# Patient Record
Sex: Male | Born: 1985 | Race: White | Hispanic: No | Marital: Single | Smoking: Former smoker
Health system: Southern US, Community
[De-identification: ages and names within clinical notes are randomized; demographics above are authoritative.]

## PROBLEM LIST (undated history)

## (undated) DIAGNOSIS — S83289A Other tear of lateral meniscus, current injury, unspecified knee, initial encounter: Secondary | ICD-10-CM

## (undated) DIAGNOSIS — K219 Gastro-esophageal reflux disease without esophagitis: Secondary | ICD-10-CM

## (undated) DIAGNOSIS — M2341 Loose body in knee, right knee: Secondary | ICD-10-CM

## (undated) DIAGNOSIS — J45909 Unspecified asthma, uncomplicated: Secondary | ICD-10-CM

## (undated) HISTORY — PX: KNEE ARTHROSCOPY W/ ACL RECONSTRUCTION: SHX1858

## (undated) HISTORY — PX: KNEE ARTHROSCOPY WITH MENISCAL REPAIR: SHX5653

## (undated) HISTORY — PX: BICEPS TENDON REPAIR: SHX566

## (undated) HISTORY — PX: LABRAL REPAIR: SHX5172

---

## 2006-05-17 ENCOUNTER — Emergency Department (HOSPITAL_COMMUNITY): Admission: EM | Admit: 2006-05-17 | Discharge: 2006-05-17 | Payer: Self-pay | Admitting: Emergency Medicine

## 2007-08-03 ENCOUNTER — Emergency Department (HOSPITAL_COMMUNITY): Admission: EM | Admit: 2007-08-03 | Discharge: 2007-08-03 | Payer: Self-pay | Admitting: Emergency Medicine

## 2007-08-08 ENCOUNTER — Emergency Department (HOSPITAL_COMMUNITY): Admission: EM | Admit: 2007-08-08 | Discharge: 2007-08-08 | Payer: Self-pay | Admitting: Emergency Medicine

## 2007-08-15 ENCOUNTER — Ambulatory Visit (HOSPITAL_COMMUNITY): Admission: RE | Admit: 2007-08-15 | Discharge: 2007-08-15 | Payer: Self-pay | Admitting: Orthopedic Surgery

## 2008-04-05 ENCOUNTER — Emergency Department (HOSPITAL_COMMUNITY): Admission: EM | Admit: 2008-04-05 | Discharge: 2008-04-05 | Payer: Self-pay | Admitting: Emergency Medicine

## 2009-08-25 IMAGING — CR DG KNEE COMPLETE 4+V*R*
4 series · 4 of 4 positions shown · non-contrast
Comparison: 05/17/2006

CLINICAL DATA: Right knee injury playing basketball

RIGHT KNEE - COMPLETE 4+ VIEW

[t knee ap right]
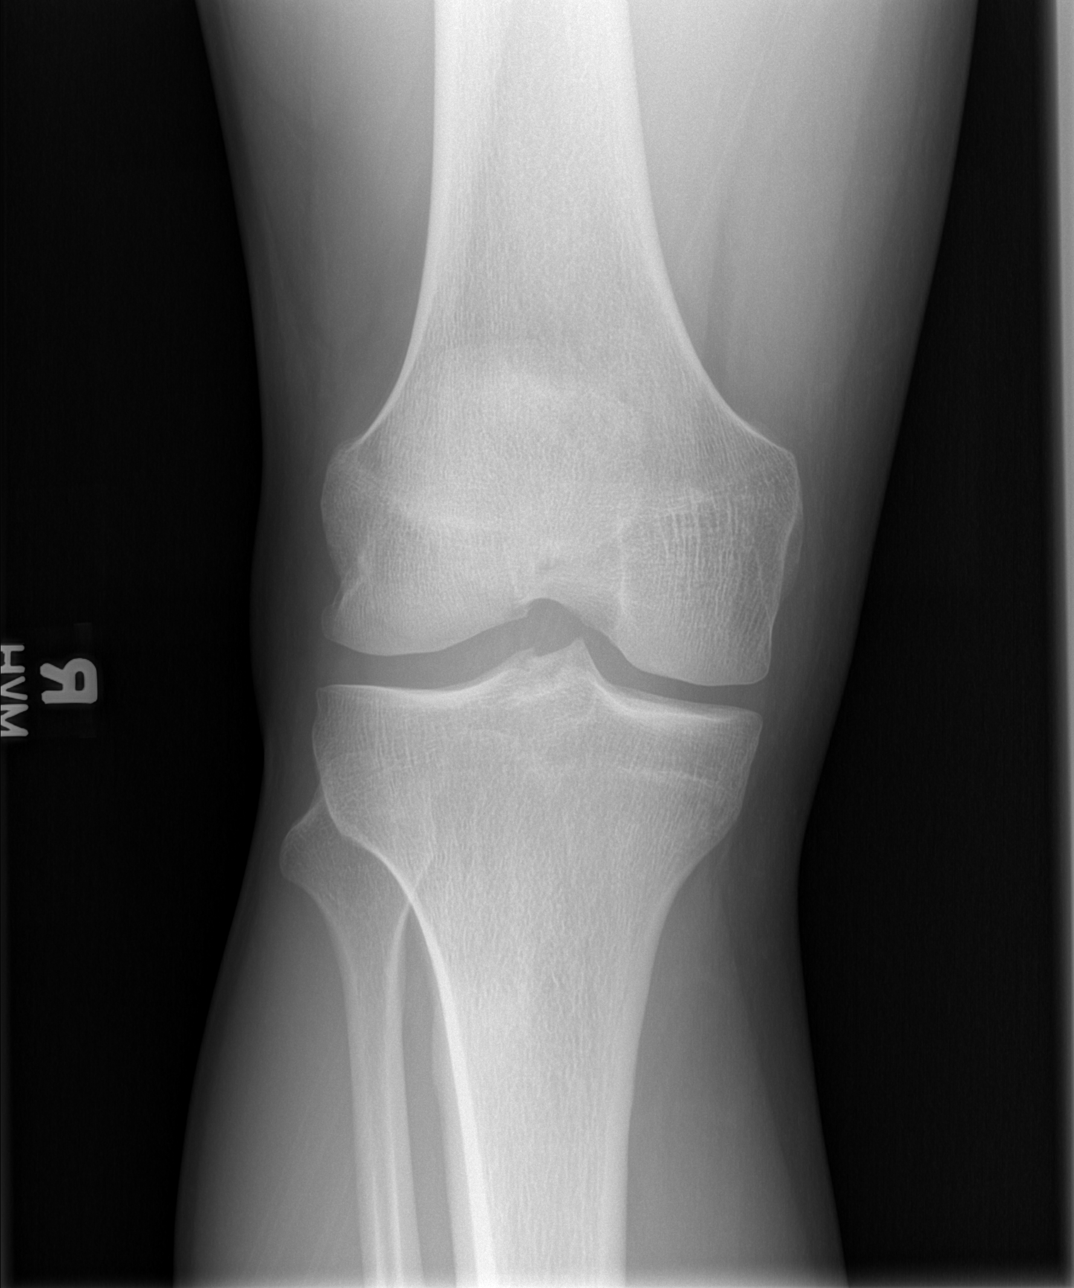

[t knee oblique right (1 of 2)]
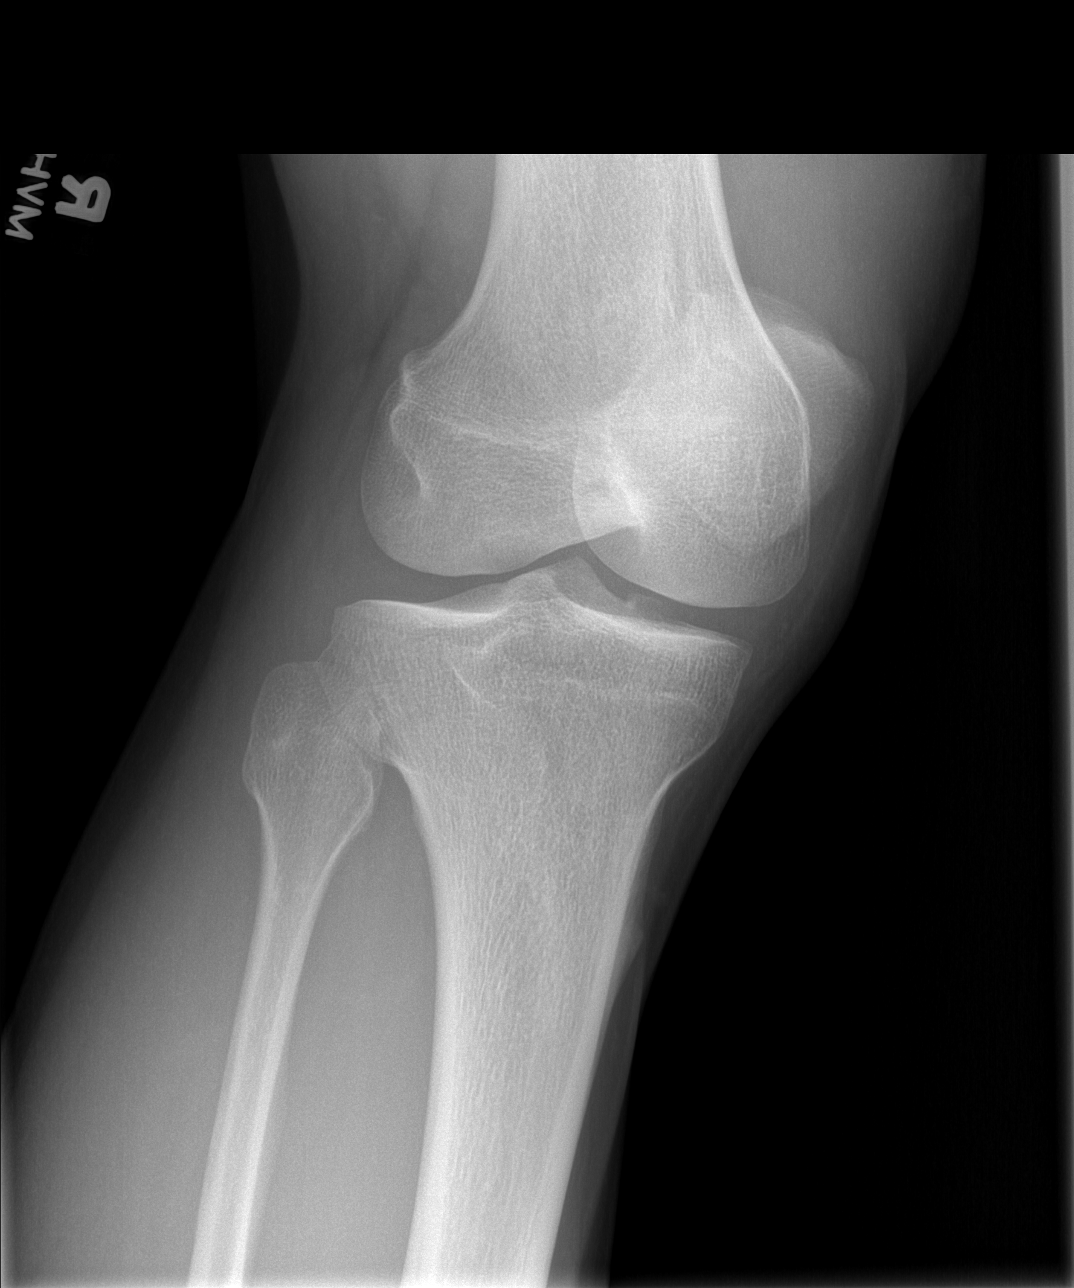

[t knee oblique right (2 of 2)]
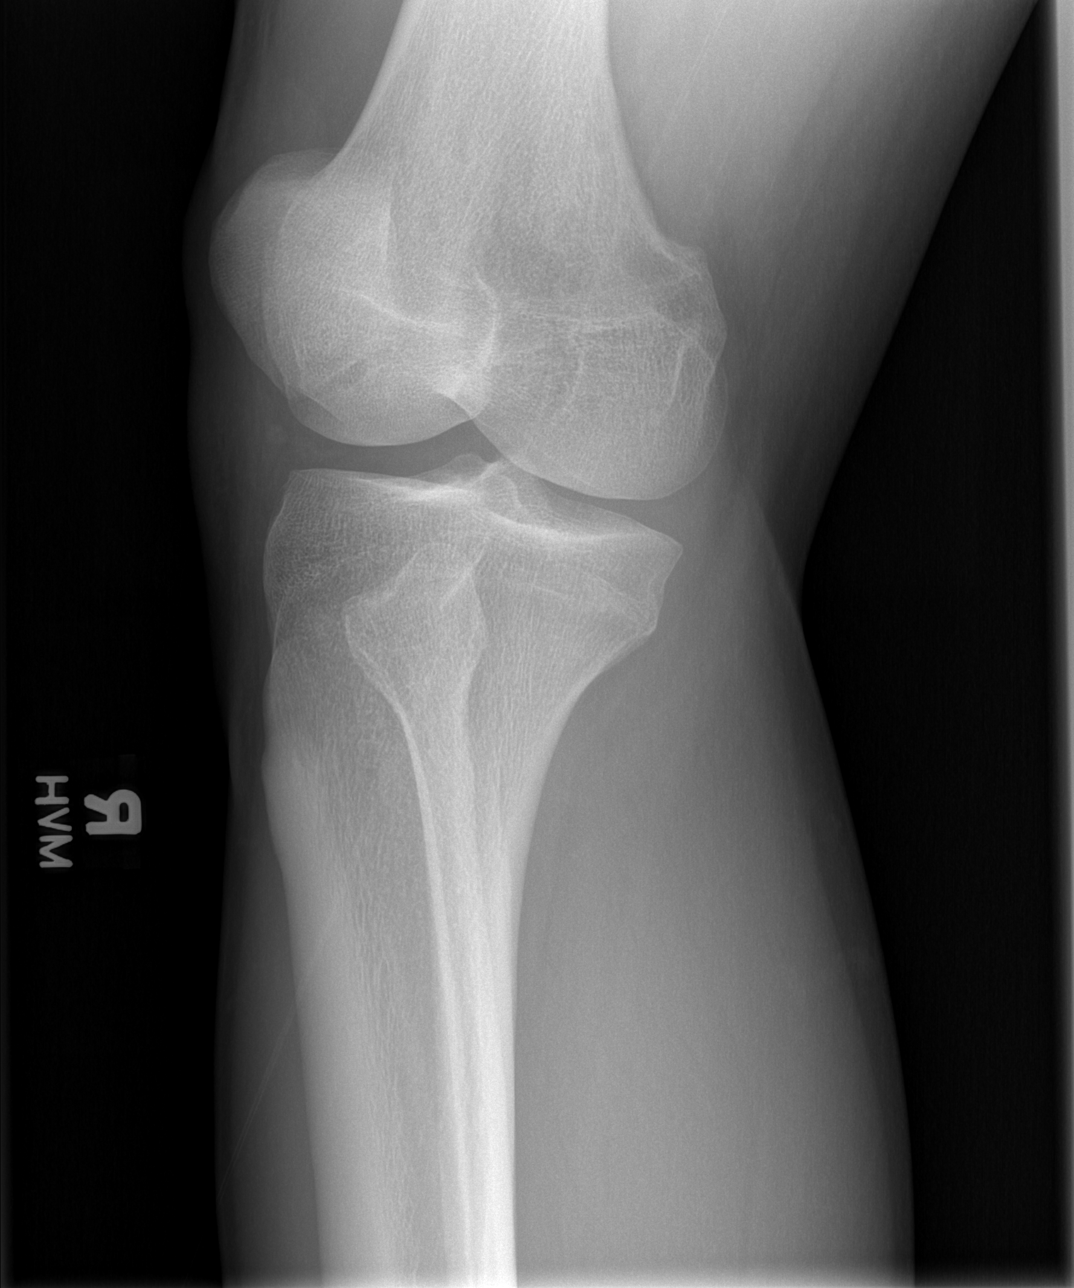

[t knee lat right]
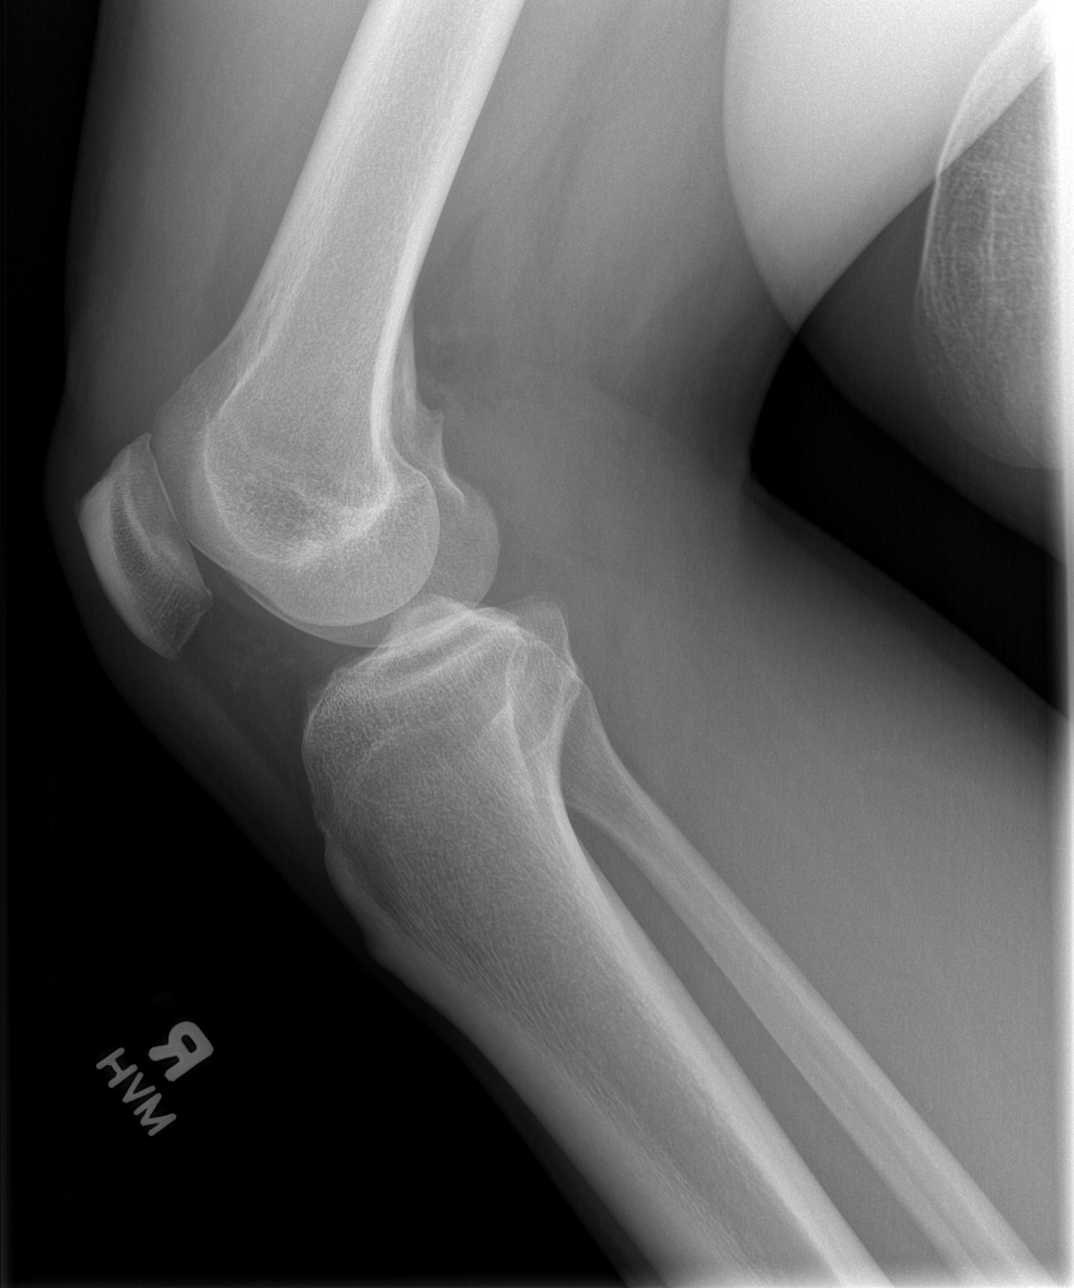

[4 of 4 positions shown; findings below may reference images not displayed]

FINDINGS: The joint spaces are maintained.  No fractures are seen.
There may be a small joint effusion.
IMPRESSION: 1.  No acute bony findings or degenerative changes.
2.  Possible small joint effusion.

## 2009-09-02 ENCOUNTER — Emergency Department (HOSPITAL_COMMUNITY): Admission: EM | Admit: 2009-09-02 | Discharge: 2009-09-02 | Payer: Self-pay | Admitting: Emergency Medicine

## 2009-10-14 ENCOUNTER — Ambulatory Visit (HOSPITAL_COMMUNITY): Admission: RE | Admit: 2009-10-14 | Discharge: 2009-10-14 | Payer: Self-pay | Admitting: Orthopedic Surgery

## 2009-10-14 HISTORY — PX: KNEE ARTHROSCOPY W/ ACL RECONSTRUCTION: SHX1858

## 2010-05-19 LAB — COMPREHENSIVE METABOLIC PANEL
Alkaline Phosphatase: 68 U/L (ref 39–117)
BUN: 16 mg/dL (ref 6–23)
Calcium: 10.3 mg/dL (ref 8.4–10.5)
Creatinine, Ser: 1.19 mg/dL (ref 0.4–1.5)
Sodium: 142 mEq/L (ref 135–145)
Total Bilirubin: 0.8 mg/dL (ref 0.3–1.2)
Total Protein: 7.5 g/dL (ref 6.0–8.3)

## 2010-05-19 LAB — APTT: aPTT: 29 seconds (ref 24–37)

## 2010-05-19 LAB — CBC
MCH: 31.3 pg (ref 26.0–34.0)
MCHC: 34.1 g/dL (ref 30.0–36.0)
MCV: 91.9 fL (ref 78.0–100.0)
Platelets: 239 10*3/uL (ref 150–400)

## 2010-05-19 LAB — PROTIME-INR
INR: 0.98 (ref 0.00–1.49)
Prothrombin Time: 13.2 seconds (ref 11.6–15.2)

## 2011-02-09 ENCOUNTER — Emergency Department (HOSPITAL_COMMUNITY)
Admission: EM | Admit: 2011-02-09 | Discharge: 2011-02-09 | Disposition: A | Discharge status: Home / Self Care | Payer: Self-pay | Attending: Emergency Medicine | Admitting: Emergency Medicine

## 2011-02-09 ENCOUNTER — Encounter: Payer: Self-pay | Admitting: *Deleted

## 2011-02-09 DIAGNOSIS — R059 Cough, unspecified: Secondary | ICD-10-CM | POA: Insufficient documentation

## 2011-02-09 DIAGNOSIS — R05 Cough: Secondary | ICD-10-CM | POA: Insufficient documentation

## 2011-02-09 DIAGNOSIS — J069 Acute upper respiratory infection, unspecified: Secondary | ICD-10-CM | POA: Insufficient documentation

## 2011-02-09 DIAGNOSIS — J329 Chronic sinusitis, unspecified: Secondary | ICD-10-CM | POA: Insufficient documentation

## 2011-02-09 MED ORDER — MAGIC MOUTHWASH W/LIDOCAINE: 5.0000 mL | Freq: Four times a day (QID) | ORAL | Status: DC | PRN

## 2011-02-09 NOTE — ED Provider Notes (Signed)
History     CSN: [id number] Arrival date & time: 02/09/2011  8:13 PM   First MD Initiated Contact with Patient 02/09/11 2033      Chief Complaint  Patient presents with  . URI    (Consider location/radiation/quality/duration/timing/severity/associated sxs/prior treatment) Patient is a 25 y.o. male presenting with URI. The history is provided by the patient.  URI The primary symptoms include cough. Primary symptoms do not include fever, sore throat, wheezing, nausea or vomiting. Primary symptoms comment: sinus drainage and gum pain The current episode started 3 to 5 days ago. This is a new problem. The problem has been gradually worsening.  The onset of the illness is associated with exposure to sick contacts. Symptoms associated with the illness include chills, facial pain, sinus pressure, congestion and rhinorrhea. Associated symptoms comments: Gum pain . The following treatments were addressed: Acetaminophen was ineffective. A decongestant was ineffective. NSAIDs were ineffective.    History reviewed. No pertinent past medical history.  History reviewed. No pertinent past surgical history.  History reviewed. No pertinent family history.  History  Substance Use Topics  . Smoking status: Never Smoker   . Smokeless tobacco: Not on file  . Alcohol Use: Yes      Review of Systems  Constitutional: Positive for chills. Negative for fever.  HENT: Positive for congestion, rhinorrhea and sinus pressure. Negative for sore throat.   Respiratory: Positive for cough. Negative for wheezing.   Gastrointestinal: Negative for nausea and vomiting.  All other systems reviewed and are negative.    Allergies  Review of patient's allergies indicates no known allergies.  Home Medications   Current Outpatient Rx  Name Route Sig Dispense Refill  . MAGIC MOUTHWASH W/LIDOCAINE Oral Take 5 mLs by mouth 4 (four) times daily as needed. 30 mL 1    BP 124/76  Pulse 88  Temp(Src) 98.1 F  (36.7 C) (Oral)  Resp 20  SpO2 100%  Physical Exam  Nursing note and vitals reviewed. Constitutional: He is oriented to person, place, and time. He appears well-developed and well-nourished. No distress.  HENT:  Head: Normocephalic and atraumatic.  Mouth/Throat: Oropharynx is clear and moist.       No abnormalities of the gums.  No exudate or lesions  Eyes: Conjunctivae and EOM are normal. Pupils are equal, round, and reactive to light.  Neck: Normal range of motion. Neck supple.  Cardiovascular: Normal rate, regular rhythm and intact distal pulses.   No murmur heard. Pulmonary/Chest: Effort normal and breath sounds normal. No respiratory distress. He has no wheezes. He has no rales.  Abdominal: Soft. He exhibits no distension. There is no tenderness. There is no rebound and no guarding.  Musculoskeletal: Normal range of motion. He exhibits no edema and no tenderness.  Lymphadenopathy:    He has no cervical adenopathy.  Neurological: He is alert and oriented to person, place, and time.  Skin: Skin is warm and dry. No rash noted. No erythema.  Psychiatric: He has a normal mood and affect. His behavior is normal.    ED Course  Procedures (including critical care time)  Labs Reviewed - No data to display No results found.   1. URI, acute   2. Sinusitis       MDM   Pt with symptoms consistent with viral URI.  Well appearing here.  No signs of breathing difficulty  No signs of pharyngitis, otitis or abnormal abdominal findings.  Pt states that his gums hurt however there are no lesions or signs  of abnormalities of the gums.  Teeth and gums appear healthy and may be from URI and sinus drainage.  Will give magic mouthwash for pain and to continue NSAIDs and nasal sprays.          Gwyneth Sprout, MD 02/10/11 [phone]

## 2011-02-09 NOTE — ED Notes (Signed)
Pt in c/o cough and congestion and aches x3-4 days

## 2011-02-11 ENCOUNTER — Ambulatory Visit: Payer: Self-pay

## 2011-02-11 DIAGNOSIS — J209 Acute bronchitis, unspecified: Secondary | ICD-10-CM

## 2011-02-11 DIAGNOSIS — K0510 Chronic gingivitis, plaque induced: Secondary | ICD-10-CM

## 2011-02-11 DIAGNOSIS — J019 Acute sinusitis, unspecified: Secondary | ICD-10-CM

## 2013-12-29 ENCOUNTER — Other Ambulatory Visit: Payer: Self-pay | Admitting: Family Medicine

## 2013-12-29 DIAGNOSIS — R32 Unspecified urinary incontinence: Secondary | ICD-10-CM

## 2013-12-29 DIAGNOSIS — R351 Nocturia: Secondary | ICD-10-CM

## 2013-12-31 ENCOUNTER — Ambulatory Visit
Admission: RE | Admit: 2013-12-31 | Discharge: 2013-12-31 | Disposition: A | Discharge status: Home / Self Care | Payer: BC Managed Care – PPO | Source: Ambulatory Visit | Attending: Family Medicine | Admitting: Family Medicine

## 2013-12-31 DIAGNOSIS — R32 Unspecified urinary incontinence: Secondary | ICD-10-CM

## 2013-12-31 DIAGNOSIS — R351 Nocturia: Secondary | ICD-10-CM

## 2015-10-04 DIAGNOSIS — M2341 Loose body in knee, right knee: Secondary | ICD-10-CM

## 2015-10-04 DIAGNOSIS — S83289A Other tear of lateral meniscus, current injury, unspecified knee, initial encounter: Secondary | ICD-10-CM

## 2015-10-04 HISTORY — DX: Loose body in knee, right knee: M23.41

## 2015-10-04 HISTORY — DX: Other tear of lateral meniscus, current injury, unspecified knee, initial encounter: S83.289A

## 2015-10-27 ENCOUNTER — Encounter (HOSPITAL_BASED_OUTPATIENT_CLINIC_OR_DEPARTMENT_OTHER): Payer: Self-pay | Admitting: Physician Assistant

## 2015-10-27 DIAGNOSIS — S83281A Other tear of lateral meniscus, current injury, right knee, initial encounter: Secondary | ICD-10-CM | POA: Diagnosis present

## 2015-10-27 DIAGNOSIS — M2341 Loose body in knee, right knee: Secondary | ICD-10-CM | POA: Diagnosis present

## 2015-10-27 DIAGNOSIS — Z9889 Other specified postprocedural states: Secondary | ICD-10-CM

## 2015-10-27 NOTE — H&P (Signed)
Joel Kelly is an 30 y.o. male.   Chief Complaint: right knee pain HPI: Joel is a 30 year-old seen for follow up evaluation from his right knee possible meniscal tear.  Two years status post right knee arthroscopy for partial meniscectomy and chondroplasty and he is status post ACL reconstruction by Dr. Lajoyce Cornersuda prior to that.  He has been having intermittent catching and popping in the knee without a new injury.  He underwent a right knee MRI on October 15, 2015 that revealed an indistinct ACL graft, however this is unchanged from his previous MRI prior to his previous knee arthroscopy where his ACL graft was intact.  He does have a possible loose femoral interference screw, but this is unchanged as well.  No definite new meniscal tear.    Past Medical History:  Diagnosis Date  . Acute lateral meniscus tear of right knee   . Loose body of right knee   . Status post anterior cruciate ligament surgery     No past surgical history on file.  No family history on file. Social History:  reports that he has never smoked. He does not have any smokeless tobacco history on file. He reports that he drinks alcohol. He reports that he does not use drugs.  Allergies: No Known Allergies  No prescriptions prior to admission.    No results found for this or any previous visit (from the past 48 hour(s)). No results found.  Review of Systems  Constitutional: Negative.   HENT: Negative.   Eyes: Negative.   Respiratory: Negative.   Cardiovascular: Negative.   Gastrointestinal: Negative.   Genitourinary: Negative.   Musculoskeletal: Positive for joint pain.  Skin: Negative.   Neurological: Negative.   Endo/Heme/Allergies: Negative.   Psychiatric/Behavioral: Negative.     There were no vitals taken for this visit. Physical Exam  Constitutional: He is oriented to person, place, and time. He appears well-developed and well-nourished.  HENT:  Head: Normocephalic and atraumatic.  Mouth/Throat:  Oropharynx is clear and moist.  Eyes: Conjunctivae and EOM are normal. Pupils are equal, round, and reactive to light.  Neck: Neck supple.  Cardiovascular: Normal rate.   Respiratory: Effort normal.  GI: Soft.  Genitourinary:  Genitourinary Comments: Not pertinent to current symptomatology therefore not examined.  Musculoskeletal:   Examination of his right knee reveals pain laterally and posterolaterally.  1+ synovitis.  Full range of motion.  Knee is stable with normal patella tracking.  Previous well healed incisions are noted.     Neurological: He is alert and oriented to person, place, and time.  Skin: Skin is warm and dry.  Psychiatric: He has a normal mood and affect. His behavior is normal.     Assessment Active Problems:   Acute lateral meniscus tear of right knee   Status post anterior cruciate ligament surgery   Loose body of right knee   Plan Right knee arthroscopy with partial lateral meniscectomy and loose body excision.  The risks, benefits, and possible complications of the procedure were discussed in detail with the patient.  The patient is without question.  Pascal LuxSHEPPERSON,Raisha Brabender J, PA-C 10/27/2015, 4:49 PM

## 2015-10-28 ENCOUNTER — Encounter (HOSPITAL_BASED_OUTPATIENT_CLINIC_OR_DEPARTMENT_OTHER): Payer: Self-pay | Admitting: *Deleted

## 2015-11-02 ENCOUNTER — Encounter (HOSPITAL_BASED_OUTPATIENT_CLINIC_OR_DEPARTMENT_OTHER)
Admission: RE | Disposition: A | Discharge status: Home / Self Care | Payer: Self-pay | Source: Ambulatory Visit | Attending: Orthopedic Surgery

## 2015-11-02 ENCOUNTER — Ambulatory Visit (HOSPITAL_BASED_OUTPATIENT_CLINIC_OR_DEPARTMENT_OTHER)
Admission: RE | Admit: 2015-11-02 | Discharge: 2015-11-02 | Disposition: A | Discharge status: Home / Self Care | Payer: 59 | Source: Ambulatory Visit | Attending: Orthopedic Surgery | Admitting: Orthopedic Surgery

## 2015-11-02 ENCOUNTER — Ambulatory Visit (HOSPITAL_BASED_OUTPATIENT_CLINIC_OR_DEPARTMENT_OTHER): Payer: 59 | Admitting: Anesthesiology

## 2015-11-02 ENCOUNTER — Encounter (HOSPITAL_BASED_OUTPATIENT_CLINIC_OR_DEPARTMENT_OTHER): Payer: Self-pay | Admitting: Certified Registered"

## 2015-11-02 DIAGNOSIS — M2341 Loose body in knee, right knee: Secondary | ICD-10-CM | POA: Diagnosis present

## 2015-11-02 DIAGNOSIS — M21961 Unspecified acquired deformity of right lower leg: Secondary | ICD-10-CM | POA: Insufficient documentation

## 2015-11-02 DIAGNOSIS — X501XXA Overexertion from prolonged static or awkward postures, initial encounter: Secondary | ICD-10-CM | POA: Diagnosis not present

## 2015-11-02 DIAGNOSIS — S83281A Other tear of lateral meniscus, current injury, right knee, initial encounter: Secondary | ICD-10-CM | POA: Diagnosis present

## 2015-11-02 DIAGNOSIS — M94261 Chondromalacia, right knee: Secondary | ICD-10-CM | POA: Diagnosis not present

## 2015-11-02 DIAGNOSIS — K219 Gastro-esophageal reflux disease without esophagitis: Secondary | ICD-10-CM | POA: Insufficient documentation

## 2015-11-02 DIAGNOSIS — Z9889 Other specified postprocedural states: Secondary | ICD-10-CM

## 2015-11-02 DIAGNOSIS — T84032A Mechanical loosening of internal right knee prosthetic joint, initial encounter: Secondary | ICD-10-CM | POA: Diagnosis not present

## 2015-11-02 DIAGNOSIS — Y838 Other surgical procedures as the cause of abnormal reaction of the patient, or of later complication, without mention of misadventure at the time of the procedure: Secondary | ICD-10-CM | POA: Insufficient documentation

## 2015-11-02 HISTORY — DX: Gastro-esophageal reflux disease without esophagitis: K21.9

## 2015-11-02 HISTORY — PX: KNEE ARTHROSCOPY WITH LATERAL MENISECTOMY: SHX6193

## 2015-11-02 HISTORY — DX: Other tear of lateral meniscus, current injury, unspecified knee, initial encounter: S83.289A

## 2015-11-02 HISTORY — DX: Loose body in knee, right knee: M23.41

## 2015-11-02 SURGERY — ARTHROSCOPY, KNEE, WITH LATERAL MENISCECTOMY
Anesthesia: General | Site: Knee | Laterality: Right

## 2015-11-02 MED ORDER — FENTANYL CITRATE (PF) 100 MCG/2ML IJ SOLN
INTRAMUSCULAR | Status: AC
  Filled 2015-11-02: qty 2

## 2015-11-02 MED ORDER — LACTATED RINGERS IV SOLN
INTRAVENOUS | Status: DC
  Administered 2015-11-02: 14:00:00 via INTRAVENOUS

## 2015-11-02 MED ORDER — HYDROMORPHONE HCL 1 MG/ML IJ SOLN
INTRAMUSCULAR | Status: AC
  Filled 2015-11-02: qty 1

## 2015-11-02 MED ORDER — CEFAZOLIN SODIUM-DEXTROSE 2-4 GM/100ML-% IV SOLN
2.0000 g | INTRAVENOUS | Status: AC
Start: 2015-11-03 — End: 2015-11-02
  Administered 2015-11-02: 2 g via INTRAVENOUS

## 2015-11-02 MED ORDER — LIDOCAINE 2% (20 MG/ML) 5 ML SYRINGE
INTRAMUSCULAR | Status: DC | PRN
  Administered 2015-11-02: 60 mg via INTRAVENOUS

## 2015-11-02 MED ORDER — POVIDONE-IODINE 7.5 % EX SOLN: Freq: Once | CUTANEOUS | Status: DC

## 2015-11-02 MED ORDER — HYDROCODONE-ACETAMINOPHEN 5-325 MG PO TABS
1.0000 | ORAL_TABLET | Freq: Once | ORAL | Status: AC | PRN
  Administered 2015-11-02: 1 via ORAL

## 2015-11-02 MED ORDER — BUPIVACAINE-EPINEPHRINE 0.25% -1:200000 IJ SOLN
INTRAMUSCULAR | Status: DC | PRN
  Administered 2015-11-02: 30 mL

## 2015-11-02 MED ORDER — LIDOCAINE 2% (20 MG/ML) 5 ML SYRINGE
INTRAMUSCULAR | Status: AC
  Filled 2015-11-02: qty 5

## 2015-11-02 MED ORDER — ONDANSETRON HCL 4 MG/2ML IJ SOLN
INTRAMUSCULAR | Status: AC
  Filled 2015-11-02: qty 2

## 2015-11-02 MED ORDER — FENTANYL CITRATE (PF) 100 MCG/2ML IJ SOLN
50.0000 ug | INTRAMUSCULAR | Status: AC | PRN
  Administered 2015-11-02 (×4): 50 ug via INTRAVENOUS

## 2015-11-02 MED ORDER — LACTATED RINGERS IV SOLN
INTRAVENOUS | Status: DC
  Administered 2015-11-02 (×2): via INTRAVENOUS

## 2015-11-02 MED ORDER — SCOPOLAMINE 1 MG/3DAYS TD PT72: 1.0000 | MEDICATED_PATCH | Freq: Once | TRANSDERMAL | Status: DC | PRN

## 2015-11-02 MED ORDER — PROMETHAZINE HCL 25 MG/ML IJ SOLN: 6.2500 mg | INTRAMUSCULAR | Status: DC | PRN

## 2015-11-02 MED ORDER — CEFAZOLIN SODIUM-DEXTROSE 2-4 GM/100ML-% IV SOLN
INTRAVENOUS | Status: AC
  Filled 2015-11-02: qty 100

## 2015-11-02 MED ORDER — HYDROCODONE-ACETAMINOPHEN 7.5-325 MG PO TABS: 1.0000 | ORAL_TABLET | Freq: Once | ORAL | Status: DC | PRN

## 2015-11-02 MED ORDER — HYDROCODONE-ACETAMINOPHEN 5-325 MG PO TABS
ORAL_TABLET | ORAL | Status: AC
  Filled 2015-11-02: qty 1

## 2015-11-02 MED ORDER — MIDAZOLAM HCL 2 MG/2ML IJ SOLN
1.0000 mg | INTRAMUSCULAR | Status: DC | PRN
  Administered 2015-11-02: 2 mg via INTRAVENOUS

## 2015-11-02 MED ORDER — PROPOFOL 10 MG/ML IV BOLUS
INTRAVENOUS | Status: DC | PRN
  Administered 2015-11-02: 200 mg via INTRAVENOUS
  Administered 2015-11-02: 100 mg via INTRAVENOUS

## 2015-11-02 MED ORDER — HYDROMORPHONE HCL 1 MG/ML IJ SOLN
0.2500 mg | INTRAMUSCULAR | Status: DC | PRN
  Administered 2015-11-02 (×4): 0.5 mg via INTRAVENOUS

## 2015-11-02 MED ORDER — MIDAZOLAM HCL 2 MG/2ML IJ SOLN
INTRAMUSCULAR | Status: AC
  Filled 2015-11-02: qty 2

## 2015-11-02 MED ORDER — LIDOCAINE HCL (CARDIAC) 20 MG/ML IV SOLN: INTRAVENOUS | Status: DC | PRN

## 2015-11-02 MED ORDER — GLYCOPYRROLATE 0.2 MG/ML IJ SOLN: 0.2000 mg | Freq: Once | INTRAMUSCULAR | Status: DC | PRN

## 2015-11-02 MED ORDER — DEXAMETHASONE SODIUM PHOSPHATE 10 MG/ML IJ SOLN
INTRAMUSCULAR | Status: AC
  Filled 2015-11-02: qty 1

## 2015-11-02 MED ORDER — HYDROCODONE-ACETAMINOPHEN 5-325 MG PO TABS: ORAL_TABLET | ORAL | 0 refills | Status: DC

## 2015-11-02 SURGICAL SUPPLY — 46 items
BANDAGE ACE 6X5 VEL STRL LF (GAUZE/BANDAGES/DRESSINGS) ×3 IMPLANT
BLADE CUDA GRT WHITE 3.5 (BLADE) IMPLANT
BLADE CUTTER GATOR 3.5 (BLADE) ×2 IMPLANT
BLADE GREAT WHITE 4.2 (BLADE) IMPLANT
BLADE GREAT WHITE 4.2MM (BLADE)
BLADE SURG 15 STRL LF DISP TIS (BLADE) IMPLANT
BLADE SURG 15 STRL SS (BLADE)
BNDG COHESIVE 4X5 TAN STRL (GAUZE/BANDAGES/DRESSINGS) ×2 IMPLANT
BUR OVAL 4.0 (BURR) ×2 IMPLANT
DRAPE ARTHROSCOPY W/POUCH 90 (DRAPES) ×3 IMPLANT
DURAPREP 26ML APPLICATOR (WOUND CARE) ×3 IMPLANT
GAUZE SPONGE 4X4 12PLY STRL (GAUZE/BANDAGES/DRESSINGS) ×3 IMPLANT
GAUZE XEROFORM 1X8 LF (GAUZE/BANDAGES/DRESSINGS) ×3 IMPLANT
GLOVE BIO SURGEON STRL SZ 6.5 (GLOVE) ×1 IMPLANT
GLOVE BIO SURGEON STRL SZ7 (GLOVE) ×3 IMPLANT
GLOVE BIO SURGEONS STRL SZ 6.5 (GLOVE) ×1
GLOVE BIOGEL PI IND STRL 7.0 (GLOVE) ×1 IMPLANT
GLOVE BIOGEL PI IND STRL 7.5 (GLOVE) ×1 IMPLANT
GLOVE BIOGEL PI INDICATOR 7.0 (GLOVE) ×4
GLOVE BIOGEL PI INDICATOR 7.5 (GLOVE) ×2
GLOVE SS BIOGEL STRL SZ 7.5 (GLOVE) ×1 IMPLANT
GLOVE SUPERSENSE BIOGEL SZ 7.5 (GLOVE) ×2
GOWN STRL REUS W/ TWL LRG LVL3 (GOWN DISPOSABLE) ×3 IMPLANT
GOWN STRL REUS W/TWL LRG LVL3 (GOWN DISPOSABLE) ×9
HOLDER KNEE FOAM BLUE (MISCELLANEOUS) ×1 IMPLANT
KNEE WRAP E Z 3 GEL PACK (MISCELLANEOUS) ×3 IMPLANT
MANIFOLD NEPTUNE II (INSTRUMENTS) IMPLANT
NDL SAFETY ECLIPSE 18X1.5 (NEEDLE) ×2 IMPLANT
NEEDLE HYPO 18GX1.5 SHARP (NEEDLE) ×6
NEEDLE HYPO 22GX1.5 SAFETY (NEEDLE) IMPLANT
PACK ARTHROSCOPY DSU (CUSTOM PROCEDURE TRAY) ×3 IMPLANT
PACK BASIN DAY SURGERY FS (CUSTOM PROCEDURE TRAY) ×3 IMPLANT
PAD ALCOHOL SWAB (MISCELLANEOUS) ×6 IMPLANT
SET ARTHROSCOPY TUBING (MISCELLANEOUS) ×3
SET ARTHROSCOPY TUBING LN (MISCELLANEOUS) ×1 IMPLANT
SUCTION FRAZIER HANDLE 10FR (MISCELLANEOUS)
SUCTION TUBE FRAZIER 10FR DISP (MISCELLANEOUS) IMPLANT
SUT ETHILON 4 0 PS 2 18 (SUTURE) ×3 IMPLANT
SUT PROLENE 3 0 PS 2 (SUTURE) IMPLANT
SUT VIC AB 3-0 PS1 18 (SUTURE)
SUT VIC AB 3-0 PS1 18XBRD (SUTURE) IMPLANT
SYR 20CC LL (SYRINGE) IMPLANT
SYR 5ML LL (SYRINGE) ×3 IMPLANT
TOWEL OR 17X24 6PK STRL BLUE (TOWEL DISPOSABLE) ×3 IMPLANT
WAND STAR VAC 90 (SURGICAL WAND) IMPLANT
WATER STERILE IRR 1000ML POUR (IV SOLUTION) ×3 IMPLANT

## 2015-11-02 NOTE — Transfer of Care (Signed)
Immediate Anesthesia Transfer of Care Note  Patient: Joel Kelly  Procedure(s) Performed: Procedure(s) with comments: Right KNEE ARTHROSCOPY WITH LATERAL MENISCECTOMY and removal of foreign body, microfracture, chondroplasty (Right) - Right KNEE ARTHROSCOPY WITH LATERAL MENISCECTOMY and removal of foreign body, microfracture, chondroplasty  Patient Location: PACU  Anesthesia Type:General  Level of Consciousness: awake, sedated and patient cooperative  Airway & Oxygen Therapy: Patient Spontanous Breathing and Patient connected to face mask oxygen  Post-op Assessment: Report given to RN and Post -op Vital signs reviewed and stable  Post vital signs: Reviewed and stable  Last Vitals:  Vitals:   11/02/15 1349  BP: (!) 146/106  Pulse: 65  Resp: 16  Temp: 36.7 C    Last Pain:  Vitals:   11/02/15 1349  TempSrc: Oral  PainSc: 5          Complications: No apparent anesthesia complications

## 2015-11-02 NOTE — Anesthesia Postprocedure Evaluation (Signed)
Anesthesia Post Note  Patient: Joel Kelly  Procedure(s) Performed: Procedure(s) (LRB): Right KNEE ARTHROSCOPY WITH LATERAL MENISCECTOMY and removal of foreign body, microfracture, chondroplasty (Right)  Patient location during evaluation: PACU Anesthesia Type: General Level of consciousness: awake and alert Pain management: pain level controlled Vital Signs Assessment: post-procedure vital signs reviewed and stable Respiratory status: spontaneous breathing, nonlabored ventilation, respiratory function stable and patient connected to nasal cannula oxygen Cardiovascular status: blood pressure returned to baseline and stable Postop Assessment: no signs of nausea or vomiting Anesthetic complications: no    Last Vitals:  Vitals:   11/02/15 1630 11/02/15 1645  BP: 125/80 130/84  Pulse: 63 (!) 57  Resp: 16 15  Temp:      Last Pain:  Vitals:   11/02/15 1645  TempSrc:   PainSc: 5                  Kennieth RadFitzgerald, Samanda Buske E

## 2015-11-02 NOTE — Anesthesia Procedure Notes (Signed)
Procedure Name: LMA Insertion Date/Time: 11/02/2015 2:25 PM Performed by: Curly ShoresRAFT, Joel Kelly Pre-anesthesia Checklist: Patient identified, Emergency Drugs available, Suction available and Patient being monitored Patient Re-evaluated:Patient Re-evaluated prior to inductionOxygen Delivery Method: Circle system utilized Preoxygenation: Pre-oxygenation with 100% oxygen Intubation Type: IV induction Ventilation: Mask ventilation without difficulty LMA: LMA inserted LMA Size: 4.0 Number of attempts: 1 Airway Equipment and Method: Bite block Placement Confirmation: positive ETCO2 and breath sounds checked- equal and bilateral Tube secured with: Tape Dental Injury: Teeth and Oropharynx as per pre-operative assessment

## 2015-11-02 NOTE — Discharge Instructions (Signed)

## 2015-11-02 NOTE — Anesthesia Preprocedure Evaluation (Signed)
Anesthesia Evaluation  Patient identified by MRN, date of birth, ID band Patient awake    Reviewed: Allergy & Precautions, NPO status , Patient's Chart, lab work & pertinent test results  Airway Mallampati: II  TM Distance: >3 FB     Dental   Pulmonary neg pulmonary ROS,    Pulmonary exam normal        Cardiovascular negative cardio ROS Normal cardiovascular exam     Neuro/Psych negative neurological ROS     GI/Hepatic Neg liver ROS, GERD  ,  Endo/Other  negative endocrine ROS  Renal/GU negative Renal ROS     Musculoskeletal   Abdominal   Peds  Hematology negative hematology ROS (+)   Anesthesia Other Findings   Reproductive/Obstetrics                             Anesthesia Physical Anesthesia Plan  ASA: II  Anesthesia Plan: General   Post-op Pain Management:    Induction: Intravenous  Airway Management Planned: LMA  Additional Equipment:   Intra-op Plan:   Post-operative Plan: Extubation in OR  Informed Consent: I have reviewed the patients History and Physical, chart, labs and discussed the procedure including the risks, benefits and alternatives for the proposed anesthesia with the patient or authorized representative who has indicated his/her understanding and acceptance.   Dental advisory given  Plan Discussed with: CRNA  Anesthesia Plan Comments:         Anesthesia Quick Evaluation

## 2015-11-02 NOTE — Interval H&P Note (Signed)
History and Physical Interval Note:  11/02/2015 12:34 PM  Joel Kelly  has presented today for surgery, with the diagnosis of tear right lateral meniscus and loose body in knee  The various methods of treatment have been discussed with the patient and family. After consideration of risks, benefits and other options for treatment, the patient has consented to  Procedure(s): Right KNEE ARTHROSCOPY WITH LATERAL MENISCECTOMY and removal of foreign body (Right) as a surgical intervention .  The patient's history has been reviewed, patient examined, no change in status, stable for surgery.  I have reviewed the patient's chart and labs.  Questions were answered to the patient's satisfaction.     Salvatore MarvelWAINER,Juwann Sherk A

## 2015-11-03 ENCOUNTER — Encounter (HOSPITAL_BASED_OUTPATIENT_CLINIC_OR_DEPARTMENT_OTHER): Payer: Self-pay | Admitting: Orthopedic Surgery

## 2015-11-03 NOTE — Op Note (Signed)
NAME:  Joel Kelly, Joel Kelly               ACCOUNT NO.:  [id number]  MEDICAL RECORD NO.:  [id number]  LOCATION:                                 FACILITY:  PHYSICIAN:  Elana Alm. Thurston Hole, M.D. DATE OF BIRTH:  08/14/1985  DATE OF PROCEDURE:  11/02/2015 DATE OF DISCHARGE:                              OPERATIVE REPORT   PREOPERATIVE DIAGNOSES: 1. Right knee acute traumatic lateral meniscus tear. 2. Right knee retained loose foreign bodies x2/ACL screws. 3. Right knee acute traumatic lateral femoral condyle grade 4     osteochondral defect. 4. Right knee chondromalacia.  POSTOPERATIVE DIAGNOSES: 1. Right knee acute traumatic lateral meniscus tear. 2. Right knee retained loose foreign bodies x2/ACL screws. 3. Right knee acute traumatic lateral femoral condyle grade 4     osteochondral defect. 4. Right knee chondromalacia.  PROCEDURE: 1. Right knee EUA followed by arthroscopic partial lateral     meniscectomy. 2. Right knee removal of retained loose foreign bodies x2. 3. Right knee chondroplasty.  SURGEON:  Salvatore Marvel, MD.  ASSISTANT:  Julien Girt, PA-C.  ANESTHESIA:  General.  OPERATIVE TIME:  1 hour.  COMPLICATIONS:  None.  INDICATION FOR PROCEDURE:  Joel Kelly is a 30 year old gentleman who has had a twisting injury to his right knee approximately 3 months ago. Exam and MRI have revealed lateral meniscus tear, lateral femoral condyle osteochondral injury, retained loose ACL screws with chondromalacia.  He has failed conservative care and is now to undergo arthroscopy.  DESCRIPTION:  Joel Kelly was brought to the operating room on November 02, 2015.  After being placed on the table supine position, he was placed under general anesthesia.  His right knee was examined.  He had full range of motion, 1+ Lachman, knee otherwise stable with normal patellar tracking.  The knee was sterilely injected with 0.25% Marcaine with epinephrine.  The right leg was prepped using sterile  DuraPrep and draped using sterile technique.  Time-out procedure was called the correct right knee was identified.  Initially, through an anterolateral portal, the arthroscope with a pump attached was placed into an anteromedial portal and arthroscopic probe was placed.  On initial inspection of the medial compartment, he had 30-40% grade 3 chondromalacia, which was debrided.  Medial meniscus was intact. Intercondylar notch of his ACL graft was somewhat diminutive but only had 2-3 mm of anterior laxity with an endpoint.  PCL was intact and stable.  Lateral compartment was inspected.  The articular cartilage showed a distinct grade 4 chondral injury measuring 1 x 1.5 cm on lateral femoral condyle, which was amenable to microfracture drilling and a curved awl was used to make multiple small punctate holes for punctate bleeding for fibrocartilage healing.  The rest of the lateral compartment articular cartilage showed 30% grade 3 chondromalacia, which was debrided.  Lateral meniscus showed a tear of the posterior and lateral horn 30%, which was resected back to stable rim.  Looking in the posterior compartment, there were 2 loose ACL screws behind the ACL graft.  Using a central patellar tendon, 1.5 cm incision separately. These 2 screws were then carefully each removed.  They were completely loose and in the in the posterior compartment.  Patellofemoral joint showed 30% grade 3 chondromalacia, which was debrided.  The patella tracked normally.  Medial and lateral gutters were free of pathology. At this point, felt that all pathology had been satisfactorily addressed.  The instruments were removed.  The anterior, medial, and central patellar tendon incisions had to be extended to remove the loose bodies were closed with 2-0 Vicryl and 4-0 nylon, lateral portal closed with 4-0 nylon.  Sterile dressings were applied and then the patient awakened and taken to recovery room in stable condition.   Needle, sponge counts were correct x2 at the end of the case.  FOLLOWUP CARE:  Joel Kelly will be followed as an outpatient, on Norco for pain.  See me back in office in a week for wound check and followup.     Joel Kelly, M.D.   ______________________________ Elana Almobert A. Thurston Kelly, M.D.    RAW/MEDQ  D:  11/02/2015  T:  11/03/2015  Job:  [id number]

## 2016-09-08 ENCOUNTER — Emergency Department (HOSPITAL_COMMUNITY)
Admission: EM | Admit: 2016-09-08 | Discharge: 2016-09-08 | Disposition: A | Discharge status: Home / Self Care | Payer: 59 | Attending: Emergency Medicine | Admitting: Emergency Medicine

## 2016-09-08 ENCOUNTER — Encounter (HOSPITAL_COMMUNITY): Payer: Self-pay | Admitting: Emergency Medicine

## 2016-09-08 DIAGNOSIS — L03115 Cellulitis of right lower limb: Secondary | ICD-10-CM

## 2016-09-08 MED ORDER — DOXYCYCLINE HYCLATE 100 MG PO CAPS: 100.0000 mg | ORAL_CAPSULE | Freq: Two times a day (BID) | ORAL | 0 refills | Status: DC

## 2016-09-08 MED ORDER — IBUPROFEN 600 MG PO TABS: 600.0000 mg | ORAL_TABLET | Freq: Four times a day (QID) | ORAL | 0 refills | Status: DC | PRN

## 2016-09-08 NOTE — Discharge Instructions (Signed)
Return in 48 hrs if you notice no improvement or if your condition worsen.

## 2016-09-08 NOTE — ED Triage Notes (Signed)
Patient is complaining of abscess on right flank. Patient stated it came up as a pimple two days ago and has gotten worse.

## 2016-09-08 NOTE — ED Notes (Addendum)
Circle drawn around area to monitor redness spreading. Pt declined d/c vital signs.

## 2016-09-08 NOTE — ED Provider Notes (Signed)
[facility]-EMERGENCY DEPT Provider Note   CSN: [id number] Arrival date & time: 09/08/16  2036     History   Chief Complaint Chief Complaint  Patient presents with  . Abscess    HPI Joel Kelly is a 31 y.o. male.  HPI   31 year old male presenting complaining of skin infection. Patient states 3 days ago he noticed a small localized skin irritation to the right side of his hip buttock area near the belt line. The area was mildly tender but within the past 2 days he has noticed moderate amount of swelling and redness to that area. He did not recall any specific insect bite and did not notice any tick bite. He denies having fever, headache, abdominal cramping, nausea or vomiting. No specific treatment tried. He is not up-to-date with tetanus. No other complaint.  Past Medical History:  Diagnosis Date  . GERD (gastroesophageal reflux disease)    TUMS as needed  . Lateral meniscal tear 10/2015   right knee  . Loose body of right knee 10/2015    Patient Active Problem List   Diagnosis Date Noted  . Acute lateral meniscus tear of right knee   . Status post anterior cruciate ligament surgery   . Loose body of right knee     Past Surgical History:  Procedure Laterality Date  . KNEE ARTHROSCOPY W/ ACL RECONSTRUCTION Right 10/14/2009  . KNEE ARTHROSCOPY W/ ACL RECONSTRUCTION Left   . KNEE ARTHROSCOPY WITH LATERAL MENISECTOMY Right 11/02/2015   Procedure: Right KNEE ARTHROSCOPY WITH LATERAL MENISCECTOMY and removal of foreign body, microfracture, chondroplasty;  Surgeon: Salvatore Marvel, MD;  Location: [facility];  Service: Orthopedics;  Laterality: Right;  Right KNEE ARTHROSCOPY WITH LATERAL MENISCECTOMY and removal of foreign body, microfracture, chondroplasty  . KNEE ARTHROSCOPY WITH MENISCAL REPAIR Right        Home Medications    Prior to Admission medications   Medication Sig Start Date End Date Taking? Authorizing Provider  HYDROcodone-acetaminophen (NORCO)  5-325 MG tablet 1-2 tablets every 4-6 hrs as needed for pain 11/02/15   Shepperson, Kirstin, PA-C    Family History History reviewed. No pertinent family history.  Social History Social History  Substance Use Topics  . Smoking status: Never Smoker  . Smokeless tobacco: Current User    Types: Chew  . Alcohol use Yes     Comment: occasionally     Allergies   Patient has no known allergies.   Review of Systems Review of Systems  Constitutional: Negative for fever.  Skin: Positive for rash.  Neurological: Negative for headaches.     Physical Exam Updated Vital Signs BP 138/84 (BP Location: Left Arm)   Pulse 92   Temp 98.1 F (36.7 C) (Oral)   Resp 16   Ht 5\' 9"  (1.753 m)   Wt 88.5 kg (195 lb)   SpO2 95%   BMI 28.80 kg/m   Physical Exam  Constitutional: He appears well-developed and well-nourished. No distress.  HENT:  Head: Atraumatic.  Eyes: Conjunctivae are normal.  Neck: Neck supple.  Neurological: He is alert.  Skin: No rash noted.  Right lateral hip/buttock: A 4 cm circumferential area of skin erythema with warmth and mild induration but no fluctuance noted. A small less than 1 cm ecchymosis noted centrally. Area is mildly tender without any streaking.  Psychiatric: He has a normal mood and affect.  Nursing note and vitals reviewed.    ED Treatments / Results  Labs (all labs ordered are listed, but only  abnormal results are displayed) Labs Reviewed - No data to display  EKG  EKG Interpretation None       Radiology No results found.  Procedures Procedures (including critical care time)  Medications Ordered in ED Medications - No data to display   Initial Impression / Assessment and Plan / ED Course  I have reviewed the triage vital signs and the nursing notes.  Pertinent labs & imaging results that were available during my care of the patient were reviewed by me and considered in my medical decision making (see chart for details).      BP 138/84 (BP Location: Left Arm)   Pulse 92   Temp 98.1 F (36.7 C) (Oral)   Resp 16   Ht 5\' 9"  (1.753 m)   Wt 88.5 kg (195 lb)   SpO2 95%   BMI 28.80 kg/m    Final Clinical Impressions(s) / ED Diagnoses   Final diagnoses:  Cellulitis of hip, right    New Prescriptions New Prescriptions   DOXYCYCLINE (VIBRAMYCIN) 100 MG CAPSULE    Take 1 capsule (100 mg total) by mouth 2 (two) times daily. One po bid x 7 days   IBUPROFEN (ADVIL,MOTRIN) 600 MG TABLET    Take 1 tablet (600 mg total) by mouth every 6 (six) hours as needed.   10:25 PM Patient here with a localized skin infection likely cellulitis involving the cutaneous tissue on his right lateral hip and buttock. It is not appears to be an abscess that will require incision and drainage. Plan to prescribe patient doxycycline for the next 10 days, the infected area was marked with a marking pen. He understands return in 48 hours if no improvement.   Fayrene Helperran, Voris Tigert, PA-C 09/08/16 2228    Arby BarrettePfeiffer, Marcy, MD 09/19/16 2018

## 2017-05-01 ENCOUNTER — Emergency Department (HOSPITAL_COMMUNITY)
Admission: EM | Admit: 2017-05-01 | Discharge: 2017-05-01 | Discharge status: Against Medical Advice | Payer: 59 | Attending: Emergency Medicine | Admitting: Emergency Medicine

## 2017-05-01 ENCOUNTER — Encounter (HOSPITAL_COMMUNITY): Payer: Self-pay | Admitting: Emergency Medicine

## 2017-05-01 DIAGNOSIS — Z5321 Procedure and treatment not carried out due to patient leaving prior to being seen by health care provider: Secondary | ICD-10-CM | POA: Diagnosis not present

## 2017-05-01 DIAGNOSIS — R1033 Periumbilical pain: Secondary | ICD-10-CM | POA: Diagnosis present

## 2017-05-01 NOTE — ED Notes (Signed)
Pt visualized by triage walking out. This RN checked the lobby and outside. Pt not visualized. This RN never spoke to patient.

## 2017-05-01 NOTE — ED Triage Notes (Signed)
Patient c/o mid upper abdominal pain. Describes it as pulsing and throbbing onset of mid morning. Pt states pain is intermittent. Pt denies any N/V/D. Adds productive cough for past few weeks, unsure if related. Denies any chest pain. Normal BM, passing gas.

## 2017-05-01 NOTE — ED Notes (Signed)
Pt walked out of room and back to lobby prior to assessment. Will see if he is coming back.

## 2018-02-24 ENCOUNTER — Emergency Department (HOSPITAL_COMMUNITY)
Admission: EM | Admit: 2018-02-24 | Discharge: 2018-02-24 | Disposition: A | Discharge status: Home / Self Care | Payer: 59 | Attending: Emergency Medicine | Admitting: Emergency Medicine

## 2018-02-24 ENCOUNTER — Emergency Department (HOSPITAL_COMMUNITY): Payer: 59

## 2018-02-24 ENCOUNTER — Encounter (HOSPITAL_COMMUNITY): Payer: Self-pay

## 2018-02-24 DIAGNOSIS — Z5321 Procedure and treatment not carried out due to patient leaving prior to being seen by health care provider: Secondary | ICD-10-CM | POA: Insufficient documentation

## 2018-02-24 DIAGNOSIS — R0602 Shortness of breath: Secondary | ICD-10-CM | POA: Insufficient documentation

## 2018-02-24 MED ORDER — ALBUTEROL SULFATE (2.5 MG/3ML) 0.083% IN NEBU
5.0000 mg | INHALATION_SOLUTION | Freq: Once | RESPIRATORY_TRACT | Status: AC
  Administered 2018-02-24: 5 mg via RESPIRATORY_TRACT
  Filled 2018-02-24: qty 6

## 2018-02-24 NOTE — ED Provider Notes (Signed)
Patient eloped from the ED prior to my evaluation.  Elmer SowMichael M. Pilar PlateBero, MD [facility] [email or web address]    Sabas SousBero, Coraline Talwar M, MD 02/24/18 [phone]

## 2018-02-24 NOTE — ED Notes (Signed)
Refusing DG at this time. Patient would like to see MD first.

## 2018-02-24 NOTE — ED Triage Notes (Addendum)
Patient c/o shob X3 days that has gotten worse today. Also c/o light headedness and not feeling like himself.  Patients states he has had sinus issues for the past couple of months from the cold weather.   Patient vomited once today from coughing up phlegm.   Denies asthma   A/Ox4  Ambulatory in triage.   Lungs clear on assessment.

## 2019-01-08 ENCOUNTER — Emergency Department (HOSPITAL_COMMUNITY)
Admission: EM | Admit: 2019-01-08 | Discharge: 2019-01-08 | Disposition: A | Discharge status: Home / Self Care | Payer: Self-pay | Attending: Emergency Medicine | Admitting: Emergency Medicine

## 2019-01-08 ENCOUNTER — Emergency Department (HOSPITAL_COMMUNITY): Payer: Self-pay

## 2019-01-08 ENCOUNTER — Encounter (HOSPITAL_COMMUNITY): Payer: Self-pay

## 2019-01-08 ENCOUNTER — Other Ambulatory Visit: Payer: Self-pay

## 2019-01-08 DIAGNOSIS — M549 Dorsalgia, unspecified: Secondary | ICD-10-CM | POA: Insufficient documentation

## 2019-01-08 DIAGNOSIS — R059 Cough, unspecified: Secondary | ICD-10-CM

## 2019-01-08 DIAGNOSIS — R05 Cough: Secondary | ICD-10-CM | POA: Insufficient documentation

## 2019-01-08 DIAGNOSIS — F17220 Nicotine dependence, chewing tobacco, uncomplicated: Secondary | ICD-10-CM | POA: Insufficient documentation

## 2019-01-08 DIAGNOSIS — Z79899 Other long term (current) drug therapy: Secondary | ICD-10-CM | POA: Insufficient documentation

## 2019-01-08 LAB — CBC
HCT: 47.9 % (ref 39.0–52.0)
Hemoglobin: 15.7 g/dL (ref 13.0–17.0)
MCH: 30 pg (ref 26.0–34.0)
MCHC: 32.8 g/dL (ref 30.0–36.0)
MCV: 91.6 fL (ref 80.0–100.0)
Platelets: 293 10*3/uL (ref 150–400)
RBC: 5.23 MIL/uL (ref 4.22–5.81)
RDW: 11.7 % (ref 11.5–15.5)
WBC: 5.6 10*3/uL (ref 4.0–10.5)
nRBC: 0 % (ref 0.0–0.2)

## 2019-01-08 LAB — BASIC METABOLIC PANEL
Anion gap: 7 (ref 5–15)
BUN: 21 mg/dL — ABNORMAL HIGH (ref 6–20)
CO2: 26 mmol/L (ref 22–32)
Calcium: 9.6 mg/dL (ref 8.9–10.3)
Chloride: 105 mmol/L (ref 98–111)
Creatinine, Ser: 1.02 mg/dL (ref 0.61–1.24)
GFR calc Af Amer: >60 mL/min (ref >60)
GFR calc non Af Amer: >60 mL/min (ref >60)
Glucose, Bld: 105 mg/dL — ABNORMAL HIGH (ref 70–99)
Potassium: 4.2 mmol/L (ref 3.5–5.1)
Sodium: 138 mmol/L (ref 135–145)

## 2019-01-08 LAB — D-DIMER, QUANTITATIVE: D-Dimer, Quant: 0.31 ug/mL-FEU (ref 0.00–0.50)

## 2019-01-08 MED ORDER — LIDOCAINE 5 % EX PTCH
1.0000 | MEDICATED_PATCH | CUTANEOUS | Status: DC
  Administered 2019-01-08: 13:00:00 1 via TRANSDERMAL
  Filled 2019-01-08: qty 1

## 2019-01-08 NOTE — ED Triage Notes (Signed)
Pt presents with c/o back pain in the center of his back for approx 7-10 days. Pt reports he has also had some cough and congestion over the last 3-4 days. Pt denies any contact with anyone else who has been sick. Pt reports the back pain is more uncomfortable when he coughs so he is concerned the pain is related to his lungs.

## 2019-01-08 NOTE — Discharge Instructions (Addendum)
Use Tylenol and ibuprofen as needed for pain. Use muscle cream such as low-dose, icy hot, BenGay, Biofreeze to help with pain. Use cough medicines as needed for cough. Follow-up with the orthopedic doctor for your back pain if it is not improving.  Return to the emergency room with any new, worsening, concerning symptoms.

## 2019-01-08 NOTE — ED Provider Notes (Signed)
[facility] DEPT Provider Note   CSN: [id number] Arrival date & time: 01/08/19  1022     History   Chief Complaint Chief Complaint  Patient presents with  . Back Pain  . Nasal Congestion    HPI Joel Kelly is a 33 y.o. male resenting for evaluation of back pain, cough, nasal congestion.  Patient states 7 to 10 days ago he developed left upper back pain.  It is constant, worse when he coughs, takes a deep breath in, or moves.  For the past several days, he has noticed worsening nasal congestion and a productive cough.  He is producing yellow sputum.  He denies associated fevers, chills, loss of taste or smell, anterior chest pain, nausea, vomiting, domino pain.  He denies sick contacts.  Patient states he has been tested several times for Covid throughout the course of the summer, has been negative each time.  He denies recent travel, surgeries, immobilization, history of cancer, history of previous DVT/PE, or hormone use.  He took Tylenol Cold and flu in the beginning of his illness, however this did not help and thus stopped.  He is currently not taking anything for his symptoms.  Illness, however this did not help and thus stopped.  He is currently not taking anything for his symptoms.  Pain is described as a sharp pain that starts in his back.  Does not radiate.  Pain is always in the same spot.  He denies sick contacts.  He denies contact with known covid 19 positive person.     HPI  Past Medical History:  Diagnosis Date  . GERD (gastroesophageal reflux disease)    TUMS as needed  . Lateral meniscal tear 10/2015   right knee  . Loose body of right knee 10/2015    Patient Active Problem List   Diagnosis Date Noted  . Acute lateral meniscus tear of right knee   . Status post anterior cruciate ligament surgery   . Loose body of right knee     Past Surgical History:  Procedure Laterality Date  . KNEE ARTHROSCOPY W/ ACL RECONSTRUCTION Right  10/14/2009  . KNEE ARTHROSCOPY W/ ACL RECONSTRUCTION Left   . KNEE ARTHROSCOPY WITH LATERAL MENISECTOMY Right 11/02/2015   Procedure: Right KNEE ARTHROSCOPY WITH LATERAL MENISCECTOMY and removal of foreign body, microfracture, chondroplasty;  Surgeon: Elsie Saas, MD;  Location: [facility];  Service: Orthopedics;  Laterality: Right;  Right KNEE ARTHROSCOPY WITH LATERAL MENISCECTOMY and removal of foreign body, microfracture, chondroplasty  . KNEE ARTHROSCOPY WITH MENISCAL REPAIR Right         Home Medications    Prior to Admission medications   Medication Sig Start Date End Date Taking? Authorizing Provider  doxycycline (VIBRAMYCIN) 100 MG capsule Take 1 capsule (100 mg total) by mouth 2 (two) times daily. One po bid x 7 days Patient not taking: Reported on 05/01/2017 09/08/16   Domenic Moras, PA-C  escitalopram (LEXAPRO) 10 MG tablet Take 10 mg by mouth at bedtime.  04/26/17   [provider]  HYDROcodone-acetaminophen (NORCO) 5-325 MG tablet 1-2 tablets every 4-6 hrs as needed for pain Patient not taking: Reported on 05/01/2017 11/02/15   Shepperson, Kirstin, PA-C  ibuprofen (ADVIL,MOTRIN) 600 MG tablet Take 1 tablet (600 mg total) by mouth every 6 (six) hours as needed. Patient not taking: Reported on 05/01/2017 09/08/16   Domenic Moras, PA-C  naltrexone (DEPADE) 50 MG tablet Take 50 mg by mouth daily.  04/26/17   [provider]    Family History History reviewed. No pertinent family history.  Social History Social History   Tobacco Use  . Smoking status: Never Smoker  . Smokeless tobacco: Current User    Types: Chew  Substance Use Topics  . Alcohol use: Yes    Comment: occasionally  . Drug use: No     Allergies   Patient has no known allergies.   Review of Systems Review of Systems  HENT: Positive for congestion.   Respiratory: Positive for cough.   Musculoskeletal: Positive for back pain.  All other systems reviewed and are negative.     Physical Exam Updated Vital Signs BP (!) 143/88   Pulse 72   Temp 99.1 F (37.3 C) (Oral)   Resp 18   Ht 5\' 10"  (1.778 m)   Wt 102.1 kg   SpO2 97%   BMI 32.28 kg/m   Physical Exam Vitals signs and nursing note reviewed.  Constitutional:      General: He is not in acute distress.    Appearance: He is well-developed.     Comments: Sitting comfortably in the bed in no acute distress  HENT:     Head: Normocephalic and atraumatic.     Comments: OP clear without ulcers or exudate.  Uvula midline with equal palate rise.  TMs nonerythematous, notbulging bilaterally. Eyes:     Extraocular Movements: Extraocular movements intact.     Conjunctiva/sclera: Conjunctivae normal.     Pupils: Pupils are equal, round, and reactive to light.  Neck:     Musculoskeletal: Normal range of motion and neck supple.  Cardiovascular:     Rate and Rhythm: Normal rate and regular rhythm.     Pulses: Normal pulses.  Pulmonary:     Effort: Pulmonary effort is normal. No respiratory distress.     Breath sounds: Normal breath sounds. No wheezing.       Comments: Speaking in full sentences.  Clear lung sounds in all fields. TTP of the mid upper back on th L side. No ttp of midline c-spine. Abdominal:     General: There is no distension.     Palpations: Abdomen is soft. There is no mass.     Tenderness: There is no abdominal tenderness. There is no guarding or rebound.  Musculoskeletal: Normal range of motion.     Comments: Increased back pain with movement of the R shoulder.   Skin:    General: Skin is warm and dry.     Capillary Refill: Capillary refill takes less than 2 seconds.  Neurological:     Mental Status: He is alert and oriented to person, place, and time.      ED Treatments / Results  Labs (all labs ordered are listed, but only abnormal results are displayed) Labs Reviewed  BASIC METABOLIC PANEL - Abnormal; Notable for the following components:      Result Value   Glucose, Bld 105  (*)    BUN 21 (*)    All other components within normal limits  CBC  D-DIMER, QUANTITATIVE (NOT AT [facility])    EKG None  Radiology Dg Chest 2 View  Result Date: 01/08/2019 CLINICAL DATA:  Cough EXAM: CHEST - 2 VIEW COMPARISON:  None. FINDINGS: The heart size and mediastinal contours are within normal limits. Both lungs are clear. The visualized skeletal structures are unremarkable. IMPRESSION: No acute abnormality of the lungs. Electronically Signed   By: Lauralyn PrimesAlex  Bibbey M.D.   On: 01/08/2019 12:40    Procedures Procedures (  including critical care time)  Medications Ordered in ED Medications - No data to display   Initial Impression / Assessment and Plan / ED Course  I have reviewed the triage vital signs and the nursing notes.  Pertinent labs & imaging results that were available during my care of the patient were reviewed by me and considered in my medical decision making (see chart for details).        Patient presented for evaluation of back pain,, Karenz_physical examination reveals nontoxic.  Consider viral illness such as Covid clinical symptoms present for over 10 days, and patient remained fever free.  Also consider pneumonia due to worsening cough and pain.  Considered PE, the patient requires, his pain is worse when he takes a deep breath in.  Also consider MSK cause of back pain. Will order cxr. If negative, will purse possible PE w/u.   cxr viewed and interpreted by me, no pna, pnx, effusion, cardiomegaly.   Dimer, cbc, and bmp reassuring.  With a negative dimer, no risk factors, and reproducible pain, doubt PE. likely viral vs mks. discussed findings with pt. discussed supportive medicine at home and f/u with ortho if not improving for presumed msk cause. At this time, pt appears safe for d/c. Return precautions given. Pt states he understands and agrees to plan.   Final Clinical Impressions(s) / ED Diagnoses   Final diagnoses:  Upper back pain on right side  Cough     ED Discharge Orders    None       Alveria Apley, PA-C 01/08/19 1947    Vanetta Mulders, MD 01/13/19 [phone]

## 2021-02-03 ENCOUNTER — Ambulatory Visit (INDEPENDENT_AMBULATORY_CARE_PROVIDER_SITE_OTHER): Payer: BC Managed Care – PPO

## 2021-02-03 ENCOUNTER — Ambulatory Visit: Payer: BC Managed Care – PPO | Admitting: Pulmonary Disease

## 2021-02-03 ENCOUNTER — Other Ambulatory Visit: Payer: Self-pay

## 2021-02-03 ENCOUNTER — Encounter: Payer: Self-pay | Admitting: Pulmonary Disease

## 2021-02-03 VITALS — BP 124/60 | HR 61 | Ht 70.0 in | Wt 219.4 lb

## 2021-02-03 DIAGNOSIS — R062 Wheezing: Secondary | ICD-10-CM | POA: Diagnosis not present

## 2021-02-03 LAB — CBC WITH DIFFERENTIAL/PLATELET
Basophils Absolute: 0.1 10*3/uL (ref 0.0–0.1)
Basophils Relative: 0.6 % (ref 0.0–3.0)
Eosinophils Absolute: 0.2 10*3/uL (ref 0.0–0.7)
Eosinophils Relative: 2.4 % (ref 0.0–5.0)
HCT: 43.1 % (ref 39.0–52.0)
Hemoglobin: 14.4 g/dL (ref 13.0–17.0)
Lymphocytes Relative: 34.1 % (ref 12.0–46.0)
Lymphs Abs: 2.7 10*3/uL (ref 0.7–4.0)
MCHC: 33.5 g/dL (ref 30.0–36.0)
MCV: 89 fl (ref 78.0–100.0)
Monocytes Absolute: 0.8 10*3/uL (ref 0.1–1.0)
Monocytes Relative: 10.3 % (ref 3.0–12.0)
Neutro Abs: 4.1 10*3/uL (ref 1.4–7.7)
Neutrophils Relative %: 52.6 % (ref 43.0–77.0)
Platelets: 263 10*3/uL (ref 150.0–400.0)
RBC: 4.84 Mil/uL (ref 4.22–5.81)
RDW: 12.4 % (ref 11.5–15.5)
WBC: 7.9 10*3/uL (ref 4.0–10.5)

## 2021-02-03 MED ORDER — FLUTICASONE FUROATE-VILANTEROL 200-25 MCG/ACT IN AEPB: 1.0000 | INHALATION_SPRAY | Freq: Every day | RESPIRATORY_TRACT | 2 refills | Status: DC

## 2021-02-03 NOTE — Patient Instructions (Addendum)
We will get a chest x-ray today Check CBC differential, IgE Start Breo inhaler Schedule PFTs and return to clinic at next available

## 2021-02-03 NOTE — Progress Notes (Signed)
Joel Kelly    OK:[id number]    1985/09/28  Primary Care Physician:Patient, No Pcp Per (Inactive)  Referring Physician: Norman Clay, MD [facility] [place],   [id number]  Chief complaint: Consult for chest congestion, wheezing  HPI: 35 year old with no significant past medical history. Complains of chest congestion, wheezing for the past 3 months.  Symptoms are worse at night.  He had been evaluated at urgent care in August with chest x-ray that showed mild bronchitis.  He reportedly also had a chest x-ray at primary care in [facility] a month ago.  I do not have this report for review.  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  No feather pillows or comforter Smoking history: Was heavy intermittent user of cocaine, tobacco and EtOH.  He quit all of this in September 2022 Travel history: No significant travel history Relevant family history: Grandparents died of COPD.  They were heavy smokers   Outpatient Encounter Medications as of 02/03/2021  Medication Sig   [DISCONTINUED] doxycycline (VIBRAMYCIN) 100 MG capsule Take 1 capsule (100 mg total) by mouth 2 (two) times daily. One po bid x 7 days (Patient not taking: Reported on 05/01/2017)   [DISCONTINUED] escitalopram (LEXAPRO) 10 MG tablet Take 10 mg by mouth at bedtime.    [DISCONTINUED] HYDROcodone-acetaminophen (NORCO) 5-325 MG tablet 1-2 tablets every 4-6 hrs as needed for pain (Patient not taking: Reported on 05/01/2017)   [DISCONTINUED] ibuprofen (ADVIL,MOTRIN) 600 MG tablet Take 1 tablet (600 mg total) by mouth every 6 (six) hours as needed. (Patient not taking: Reported on 05/01/2017)   [DISCONTINUED] naltrexone (DEPADE) 50 MG tablet Take 50 mg by mouth daily.    No facility-administered encounter medications on file as of 02/03/2021.    Allergies as  of 02/03/2021   (No Known Allergies)    Past Medical History:  Diagnosis Date   GERD (gastroesophageal reflux disease)    TUMS as needed   Lateral meniscal tear 10/2015   right knee   Loose body of right knee 10/2015    Past Surgical History:  Procedure Laterality Date   KNEE ARTHROSCOPY W/ ACL RECONSTRUCTION Right 10/14/2009   KNEE ARTHROSCOPY W/ ACL RECONSTRUCTION Left    KNEE ARTHROSCOPY WITH LATERAL MENISECTOMY Right 11/02/2015   Procedure: Right KNEE ARTHROSCOPY WITH LATERAL MENISCECTOMY and removal of foreign body, microfracture, chondroplasty;  Surgeon: Elsie Saas, MD;  Location: [facility];  Service: Orthopedics;  Laterality: Right;  Right KNEE ARTHROSCOPY WITH LATERAL MENISCECTOMY and removal of foreign body, microfracture, chondroplasty   KNEE ARTHROSCOPY WITH MENISCAL REPAIR Right     No family history on file.  Social History   Socioeconomic History   Marital status: Single    Spouse name: Not on file   Number of children: Not on file   Years of education: Not on file   Highest education level: Not on file  Occupational History   Not on file  Tobacco Use   Smoking status: Former    Types: Cigarettes   Smokeless tobacco: Current    Types: Chew  Substance and Sexual Activity   Alcohol use: Yes    Comment: occasionally   Drug use: No   Sexual activity: Not on file  Other Topics Concern   Not on file  Social History Narrative   Not on file  Social Determinants of Health   Financial Resource Strain: Not on file  Food Insecurity: Not on file  Transportation Needs: Not on file  Physical Activity: Not on file  Stress: Not on file  Social Connections: Not on file  Intimate Partner Violence: Not on file    Review of systems: Review of Systems  Constitutional: Negative for fever and chills.  HENT: Negative.   Eyes: Negative for blurred vision.  Respiratory: as per HPI  Cardiovascular: Negative for chest pain and palpitations.   Gastrointestinal: Negative for vomiting, diarrhea, blood per rectum. Genitourinary: Negative for dysuria, urgency, frequency and hematuria.  Musculoskeletal: Negative for myalgias, back pain and joint pain.  Skin: Negative for itching and rash.  Neurological: Negative for dizziness, tremors, focal weakness, seizures and loss of consciousness.  Endo/Heme/Allergies: Negative for environmental allergies.  Psychiatric/Behavioral: Negative for depression, suicidal ideas and hallucinations.  All other systems reviewed and are negative.  Physical Exam: Blood pressure 124/60, pulse 61, height 5\' 10"  (1.778 m), weight 219 lb 6.4 oz (99.5 kg), SpO2 95 %. Gen:      No acute distress HEENT:  EOMI, sclera anicteric Neck:     No masses; no thyromegaly Lungs:    Clear to auscultation bilaterally; normal respiratory effort CV:         Regular rate and rhythm; no murmurs Abd:      + bowel sounds; soft, non-tender; no palpable masses, no distension Ext:    No edema; adequate peripheral perfusion Skin:      Warm and dry; no rash Neuro: alert and oriented x 3 Psych: normal mood and affect  Data Reviewed: Imaging: Chest x ray report 10/10/2020- No acute focal pneumonia.  Possible bronchitis.   PFTs:  Labs:  Assessment:  Assessment for dyspnea, wheezing Symptoms are suggestive of reactive airway disease, asthma with recurrent bronchitis I will get a chest x-ray today for evaluation of the lung Check CBC differential, IgE Start Breo inhaler PFTs and return to clinic in 1 month  Plan/Recommendations: Chest x-ray, PFTs CBC differential, IgE Breo inhaler  12/10/2020 MD [facility] 02/03/2021, 1:43 PM  CC: 14/04/2020, MD

## 2021-02-06 LAB — IGE: IgE (Immunoglobulin E), Serum: 304 kU/L — ABNORMAL HIGH (ref <=114)

## 2021-03-05 HISTORY — PX: HERNIA REPAIR: SHX51

## 2021-03-07 ENCOUNTER — Ambulatory Visit (INDEPENDENT_AMBULATORY_CARE_PROVIDER_SITE_OTHER): Payer: BC Managed Care – PPO | Admitting: Pulmonary Disease

## 2021-03-07 ENCOUNTER — Other Ambulatory Visit: Payer: Self-pay

## 2021-03-07 DIAGNOSIS — R062 Wheezing: Secondary | ICD-10-CM | POA: Diagnosis not present

## 2021-03-07 NOTE — Progress Notes (Signed)
Full PFT completed today ? ?

## 2021-03-28 LAB — PULMONARY FUNCTION TEST
DL/VA % pred: 113 %
DL/VA: 5.41 ml/min/mmHg/L
DLCO cor % pred: 100 %
DLCO cor: 31.95 ml/min/mmHg
DLCO unc % pred: 100 %
DLCO unc: 31.77 ml/min/mmHg
FEF 25-75 Post: 2.66 L/sec
FEF 25-75 Pre: 2.31 L/sec
FEF2575-%Change-Post: 14 %
FEF2575-%Pred-Post: 63 %
FEF2575-%Pred-Pre: 55 %
FEV1-%Change-Post: 4 %
FEV1-%Pred-Post: 75 %
FEV1-%Pred-Pre: 71 %
FEV1-Post: 3.24 L
FEV1-Pre: 3.09 L
FEV1FVC-%Change-Post: 1 %
FEV1FVC-%Pred-Pre: 86 %
FEV6-%Change-Post: 3 %
FEV6-%Pred-Post: 86 %
FEV6-%Pred-Pre: 83 %
FEV6-Post: 4.56 L
FEV6-Pre: 4.39 L
FEV6FVC-%Change-Post: 0 %
FEV6FVC-%Pred-Post: 101 %
FEV6FVC-%Pred-Pre: 100 %
FVC-%Change-Post: 3 %
FVC-%Pred-Post: 85 %
FVC-%Pred-Pre: 82 %
FVC-Post: 4.6 L
Post FEV1/FVC ratio: 71 %
Post FEV6/FVC ratio: 99 %
Pre FEV1/FVC ratio: 70 %
Pre FEV6/FVC Ratio: 99 %
RV % pred: 135 %
RV: 2.36 L
TLC % pred: 96 %
TLC: 6.67 L

## 2021-04-14 ENCOUNTER — Ambulatory Visit (INDEPENDENT_AMBULATORY_CARE_PROVIDER_SITE_OTHER): Payer: BC Managed Care – PPO | Admitting: Pulmonary Disease

## 2021-04-14 ENCOUNTER — Other Ambulatory Visit: Payer: Self-pay

## 2021-04-14 ENCOUNTER — Encounter: Payer: Self-pay | Admitting: Pulmonary Disease

## 2021-04-14 VITALS — BP 126/64 | HR 69 | Ht 70.0 in | Wt 227.4 lb

## 2021-04-14 DIAGNOSIS — J4540 Moderate persistent asthma, uncomplicated: Secondary | ICD-10-CM | POA: Diagnosis not present

## 2021-04-14 MED ORDER — FLUTICASONE FUROATE-VILANTEROL 100-25 MCG/ACT IN AEPB: 1.0000 | INHALATION_SPRAY | Freq: Every day | RESPIRATORY_TRACT | 2 refills | Status: DC

## 2021-04-14 NOTE — Patient Instructions (Signed)
I have reviewed your lung function test and labs which show mild allergies and asthma Continue the Breo inhaler.  We will send in a prescription for Breo 100 Follow-up in 6 months.

## 2021-04-14 NOTE — Progress Notes (Signed)
° °      °  Joel Kelly    [id number]    [birth year]  Primary Care Physician:Patient, No Pcp Per (Inactive)  Referring Physician: No referring provider defined for this encounter.  Chief complaint: Follow-up for moderate persistent asthma  HPI: 36 year old with no significant past medical history. Complains of chest congestion, wheezing for the past 3 months.  Symptoms are worse at night.  He had been evaluated at urgent care in August with chest x-ray that showed mild bronchitis.  He reportedly also had a chest x-ray at primary care in [facility] a month ago.  I do not have this report for review.  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  No feather pillows or comforter Smoking history: Was heavy intermittent user of cocaine, tobacco and EtOH.  He quit all of this in September 2022 Travel history: No significant travel history Relevant family history: Grandparents died of COPD.  They were heavy smokers  Interim history: He was prescribed Breo at last visit but is not using it regularly.  States that breathing is doing okay with occasional wheeze   Outpatient Encounter Medications as of 04/14/2021  Medication Sig   fluticasone furoate-vilanterol (BREO ELLIPTA) 200-25 MCG/ACT AEPB Inhale 1 puff into the lungs daily.   No facility-administered encounter medications on file as of 04/14/2021.    Physical Exam: Blood pressure 126/64, pulse 69, height 5\' 10"  (1.778 m), weight 227 lb 6.4 oz (103.1 kg), SpO2 96 %. Gen:      No acute distress HEENT:  EOMI, sclera anicteric Neck:     No masses; no thyromegaly Lungs:    Clear to auscultation bilaterally; normal respiratory effort CV:         Regular rate and rhythm; no murmurs Abd:      + bowel sounds; soft, non-tender; no palpable masses, no distension Ext:    No edema; adequate peripheral  perfusion Skin:      Warm and dry; no rash Neuro: alert and oriented x 3 Psych: normal mood and affect   Data Reviewed: Imaging: Chest x ray report 10/10/2020- No acute focal pneumonia.  Possible bronchitis.  Chest x-ray 02/03/2021-no acute cardiopulmonary disease.  I have reviewed the images personally  PFTs: 03/07/2021 FVC 4.60 [85%], FEV1 3.24 [25%], F/F 71, TLC 6.67 [96%], DLCO 31.77 [100%] Mild obstructive airway disease  Labs: CBC 02/03/2021-WBC 7.9, eos 2.4%, absolute eosinophil count 190 IgE 02/03/2021-304  Assessment:  Moderate asthma Chest x-ray reviewed with mild obstruction He is on Breo inhaler but is not using it on a regular basis We discussed need for regular use of controller medication.  We can drop the dose to 100 as symptoms are better controlled now Return to clinic in 6 months  Plan/Recommendations: Breo 100 inhaler  14/04/2020 MD [facility] 04/14/2021, 9:49 AM  CC: No ref. provider found

## 2021-09-19 ENCOUNTER — Other Ambulatory Visit: Payer: Self-pay

## 2021-09-19 ENCOUNTER — Encounter (HOSPITAL_COMMUNITY)
Admission: RE | Disposition: A | Discharge status: Home / Self Care | Payer: Self-pay | Source: Home / Self Care | Attending: Family Medicine

## 2021-09-19 ENCOUNTER — Ambulatory Visit (HOSPITAL_COMMUNITY): Payer: BC Managed Care – PPO | Admitting: Certified Registered"

## 2021-09-19 ENCOUNTER — Encounter (HOSPITAL_COMMUNITY): Payer: Self-pay | Admitting: Orthopedic Surgery

## 2021-09-19 ENCOUNTER — Observation Stay (HOSPITAL_COMMUNITY)
Admission: RE | Admit: 2021-09-19 | Discharge: 2021-09-21 | Disposition: A | Discharge status: Home / Self Care | Payer: BC Managed Care – PPO | Attending: Family Medicine | Admitting: Family Medicine

## 2021-09-19 ENCOUNTER — Other Ambulatory Visit: Payer: Self-pay | Admitting: Orthopedic Surgery

## 2021-09-19 DIAGNOSIS — Y939 Activity, unspecified: Secondary | ICD-10-CM | POA: Diagnosis not present

## 2021-09-19 DIAGNOSIS — Z9889 Other specified postprocedural states: Secondary | ICD-10-CM | POA: Diagnosis not present

## 2021-09-19 DIAGNOSIS — J4540 Moderate persistent asthma, uncomplicated: Secondary | ICD-10-CM | POA: Diagnosis not present

## 2021-09-19 DIAGNOSIS — Z79899 Other long term (current) drug therapy: Secondary | ICD-10-CM | POA: Insufficient documentation

## 2021-09-19 DIAGNOSIS — M71121 Other infective bursitis, right elbow: Secondary | ICD-10-CM | POA: Diagnosis not present

## 2021-09-19 DIAGNOSIS — Z87891 Personal history of nicotine dependence: Secondary | ICD-10-CM | POA: Diagnosis not present

## 2021-09-19 DIAGNOSIS — M7021 Olecranon bursitis, right elbow: Secondary | ICD-10-CM | POA: Diagnosis present

## 2021-09-19 DIAGNOSIS — D72829 Elevated white blood cell count, unspecified: Secondary | ICD-10-CM | POA: Diagnosis not present

## 2021-09-19 DIAGNOSIS — L03113 Cellulitis of right upper limb: Secondary | ICD-10-CM | POA: Diagnosis present

## 2021-09-19 HISTORY — PX: IRRIGATION AND DEBRIDEMENT ELBOW: SHX6886

## 2021-09-19 HISTORY — DX: Unspecified asthma, uncomplicated: J45.909

## 2021-09-19 LAB — CBC
HCT: 46.9 % (ref 39.0–52.0)
Hemoglobin: 15.9 g/dL (ref 13.0–17.0)
MCH: 30.6 pg (ref 26.0–34.0)
MCHC: 33.9 g/dL (ref 30.0–36.0)
MCV: 90.2 fL (ref 80.0–100.0)
Platelets: 270 10*3/uL (ref 150–400)
RBC: 5.2 MIL/uL (ref 4.22–5.81)
RDW: 12.5 % (ref 11.5–15.5)
WBC: 11.4 10*3/uL — ABNORMAL HIGH (ref 4.0–10.5)
nRBC: 0 % (ref 0.0–0.2)

## 2021-09-19 SURGERY — IRRIGATION AND DEBRIDEMENT ELBOW
Anesthesia: General | Site: Elbow | Laterality: Right

## 2021-09-19 MED ORDER — OXYCODONE-ACETAMINOPHEN 5-325 MG PO TABS
1.0000 | ORAL_TABLET | Freq: Four times a day (QID) | ORAL | Status: DC | PRN
  Administered 2021-09-19: 1 via ORAL

## 2021-09-19 MED ORDER — BUPIVACAINE HCL (PF) 0.25 % IJ SOLN
INTRAMUSCULAR | Status: DC | PRN
  Administered 2021-09-19: 20 mL

## 2021-09-19 MED ORDER — ORAL CARE MOUTH RINSE: 15.0000 mL | Freq: Once | OROMUCOSAL | Status: AC

## 2021-09-19 MED ORDER — MIDAZOLAM HCL 2 MG/2ML IJ SOLN
INTRAMUSCULAR | Status: AC
  Filled 2021-09-19: qty 2

## 2021-09-19 MED ORDER — ONDANSETRON HCL 4 MG/2ML IJ SOLN
INTRAMUSCULAR | Status: DC | PRN
  Administered 2021-09-19: 4 mg via INTRAVENOUS

## 2021-09-19 MED ORDER — PROPOFOL 10 MG/ML IV BOLUS
INTRAVENOUS | Status: AC
  Filled 2021-09-19: qty 20

## 2021-09-19 MED ORDER — FENTANYL CITRATE (PF) 100 MCG/2ML IJ SOLN
INTRAMUSCULAR | Status: AC
  Filled 2021-09-19: qty 2

## 2021-09-19 MED ORDER — AMISULPRIDE (ANTIEMETIC) 5 MG/2ML IV SOLN: 10.0000 mg | Freq: Once | INTRAVENOUS | Status: DC | PRN

## 2021-09-19 MED ORDER — FENTANYL CITRATE (PF) 250 MCG/5ML IJ SOLN
INTRAMUSCULAR | Status: AC
  Filled 2021-09-19: qty 5

## 2021-09-19 MED ORDER — LIDOCAINE HCL (CARDIAC) PF 100 MG/5ML IV SOSY
PREFILLED_SYRINGE | INTRAVENOUS | Status: DC | PRN
  Administered 2021-09-19: 80 mg via INTRATRACHEAL

## 2021-09-19 MED ORDER — ALBUTEROL SULFATE (2.5 MG/3ML) 0.083% IN NEBU: 2.5000 mg | INHALATION_SOLUTION | RESPIRATORY_TRACT | Status: DC | PRN

## 2021-09-19 MED ORDER — PROPOFOL 10 MG/ML IV BOLUS
INTRAVENOUS | Status: AC
Start: 2021-09-19 — End: ?
  Filled 2021-09-19: qty 20

## 2021-09-19 MED ORDER — FENTANYL CITRATE (PF) 100 MCG/2ML IJ SOLN
25.0000 ug | INTRAMUSCULAR | Status: DC | PRN
  Administered 2021-09-19 (×2): 50 ug via INTRAVENOUS

## 2021-09-19 MED ORDER — ACETAMINOPHEN 650 MG RE SUPP: 650.0000 mg | Freq: Four times a day (QID) | RECTAL | Status: DC | PRN

## 2021-09-19 MED ORDER — FENTANYL CITRATE PF 50 MCG/ML IJ SOSY: 50.0000 ug | PREFILLED_SYRINGE | INTRAMUSCULAR | Status: DC | PRN

## 2021-09-19 MED ORDER — MIDAZOLAM HCL 2 MG/2ML IJ SOLN
INTRAMUSCULAR | Status: DC | PRN
  Administered 2021-09-19: 2 mg via INTRAVENOUS

## 2021-09-19 MED ORDER — LACTATED RINGERS IV SOLN: INTRAVENOUS | Status: DC

## 2021-09-19 MED ORDER — SODIUM CHLORIDE 0.9 % IR SOLN
Status: DC | PRN
  Administered 2021-09-19: 3000 mL

## 2021-09-19 MED ORDER — SODIUM CHLORIDE 0.9 % IV SOLN
1.0000 g | INTRAVENOUS | Status: DC
  Administered 2021-09-19 – 2021-09-20 (×2): 1 g via INTRAVENOUS
  Filled 2021-09-19 (×2): qty 10

## 2021-09-19 MED ORDER — NALOXONE HCL 0.4 MG/ML IJ SOLN: 0.4000 mg | INTRAMUSCULAR | Status: DC | PRN

## 2021-09-19 MED ORDER — FENTANYL CITRATE (PF) 250 MCG/5ML IJ SOLN
INTRAMUSCULAR | Status: DC | PRN
  Administered 2021-09-19 (×2): 50 ug via INTRAVENOUS

## 2021-09-19 MED ORDER — CHLORHEXIDINE GLUCONATE 0.12 % MT SOLN
OROMUCOSAL | Status: AC
  Administered 2021-09-19: 15 mL via OROMUCOSAL
  Filled 2021-09-19: qty 15

## 2021-09-19 MED ORDER — OXYCODONE-ACETAMINOPHEN 5-325 MG PO TABS
ORAL_TABLET | ORAL | Status: AC
  Filled 2021-09-19: qty 1

## 2021-09-19 MED ORDER — PROPOFOL 10 MG/ML IV BOLUS
INTRAVENOUS | Status: DC | PRN
  Administered 2021-09-19: 250 ug via INTRAVENOUS

## 2021-09-19 MED ORDER — ONDANSETRON HCL 4 MG/2ML IJ SOLN: 4.0000 mg | Freq: Four times a day (QID) | INTRAMUSCULAR | Status: DC | PRN

## 2021-09-19 MED ORDER — CHLORHEXIDINE GLUCONATE 0.12 % MT SOLN: 15.0000 mL | Freq: Once | OROMUCOSAL | Status: AC

## 2021-09-19 MED ORDER — ACETAMINOPHEN 325 MG PO TABS: 650.0000 mg | ORAL_TABLET | Freq: Four times a day (QID) | ORAL | Status: DC | PRN

## 2021-09-19 MED ORDER — BUPIVACAINE HCL (PF) 0.25 % IJ SOLN
INTRAMUSCULAR | Status: AC
  Filled 2021-09-19: qty 30

## 2021-09-19 MED ORDER — FLUTICASONE FUROATE-VILANTEROL 100-25 MCG/ACT IN AEPB
1.0000 | INHALATION_SPRAY | Freq: Every day | RESPIRATORY_TRACT | Status: DC
  Filled 2021-09-19: qty 28

## 2021-09-19 MED ORDER — DEXAMETHASONE SODIUM PHOSPHATE 10 MG/ML IJ SOLN
INTRAMUSCULAR | Status: DC | PRN
  Administered 2021-09-19: 10 mg via INTRAVENOUS

## 2021-09-19 SURGICAL SUPPLY — 57 items
BAG COUNTER SPONGE SURGICOUNT (BAG) ×2 IMPLANT
BAG SPNG CNTER NS LX DISP (BAG) ×1
BNDG CMPR 9X4 STRL LF SNTH (GAUZE/BANDAGES/DRESSINGS) ×1
BNDG CONFORM 2 STRL LF (GAUZE/BANDAGES/DRESSINGS) IMPLANT
BNDG ELASTIC 3X5.8 VLCR STR LF (GAUZE/BANDAGES/DRESSINGS) ×2 IMPLANT
BNDG ELASTIC 4X5.8 VLCR STR LF (GAUZE/BANDAGES/DRESSINGS) ×2 IMPLANT
BNDG ESMARK 4X9 LF (GAUZE/BANDAGES/DRESSINGS) ×1 IMPLANT
BNDG GAUZE DERMACEA FLUFF (GAUZE/BANDAGES/DRESSINGS) ×1
BNDG GAUZE DERMACEA FLUFF 4 (GAUZE/BANDAGES/DRESSINGS) IMPLANT
BNDG GAUZE ELAST 4 BULKY (GAUZE/BANDAGES/DRESSINGS) ×4 IMPLANT
BNDG GZE DERMACEA 4 6PLY (GAUZE/BANDAGES/DRESSINGS) ×1
CNTNR URN SCR LID CUP LEK RST (MISCELLANEOUS) IMPLANT
CONT SPEC 4OZ STRL OR WHT (MISCELLANEOUS) ×2
CORD BIPOLAR FORCEPS 12FT (ELECTRODE) IMPLANT
COVER SURGICAL LIGHT HANDLE (MISCELLANEOUS) ×4 IMPLANT
CUFF TOURN SGL QUICK 18X4 (TOURNIQUET CUFF) ×2 IMPLANT
DRAPE SURG 17X23 STRL (DRAPES) ×2 IMPLANT
DRSG XEROFORM 1X8 (GAUZE/BANDAGES/DRESSINGS) ×1 IMPLANT
DURAPREP 26ML APPLICATOR (WOUND CARE) ×2 IMPLANT
GAUZE PACKING IODOFORM 1/2 (PACKING) ×1 IMPLANT
GAUZE PACKING IODOFORM 1/4X15 (PACKING) IMPLANT
GAUZE SPONGE 4X4 12PLY STRL (GAUZE/BANDAGES/DRESSINGS) ×3 IMPLANT
GAUZE XEROFORM 1X8 LF (GAUZE/BANDAGES/DRESSINGS) ×2 IMPLANT
GLOVE SURG SYN 8.0 (GLOVE) ×2 IMPLANT
GLOVE SURG SYN 8.0 PF PI (GLOVE) ×1 IMPLANT
GOWN STRL REUS W/ TWL LRG LVL3 (GOWN DISPOSABLE) ×1 IMPLANT
GOWN STRL REUS W/ TWL XL LVL3 (GOWN DISPOSABLE) ×1 IMPLANT
GOWN STRL REUS W/TWL LRG LVL3 (GOWN DISPOSABLE) ×2
GOWN STRL REUS W/TWL XL LVL3 (GOWN DISPOSABLE) ×2
KIT BASIN OR (CUSTOM PROCEDURE TRAY) ×2 IMPLANT
KIT TURNOVER KIT B (KITS) ×2 IMPLANT
MANIFOLD NEPTUNE II (INSTRUMENTS) ×2 IMPLANT
NDL HYPO 25GX1X1/2 BEV (NEEDLE) IMPLANT
NDL HYPO 25X1 1.5 SAFETY (NEEDLE) ×1 IMPLANT
NEEDLE HYPO 25GX1X1/2 BEV (NEEDLE) IMPLANT
NEEDLE HYPO 25X1 1.5 SAFETY (NEEDLE) ×2 IMPLANT
NS IRRIG 1000ML POUR BTL (IV SOLUTION) ×2 IMPLANT
PACK ORTHO EXTREMITY (CUSTOM PROCEDURE TRAY) ×2 IMPLANT
PAD ABD 8X10 STRL (GAUZE/BANDAGES/DRESSINGS) ×1 IMPLANT
PAD ARMBOARD 7.5X6 YLW CONV (MISCELLANEOUS) ×4 IMPLANT
PAD CAST 4YDX4 CTTN HI CHSV (CAST SUPPLIES) ×2 IMPLANT
PADDING CAST COTTON 4X4 STRL (CAST SUPPLIES) ×4
PADDING CAST SYNTHETIC 4 (CAST SUPPLIES) ×1
PADDING CAST SYNTHETIC 4X4 STR (CAST SUPPLIES) IMPLANT
SLING ARM FOAM STRAP XLG (SOFTGOODS) ×1 IMPLANT
SPIKE FLUID TRANSFER (MISCELLANEOUS) ×3 IMPLANT
SPONGE T-LAP 18X18 ~~LOC~~+RFID (SPONGE) ×2 IMPLANT
SUT ETHILON 3 0 PS 1 (SUTURE) ×2 IMPLANT
SUT VICRYL RAPIDE 4/0 PS 2 (SUTURE) IMPLANT
SWAB COLLECTION DEVICE MRSA (MISCELLANEOUS) ×2 IMPLANT
SWAB CULTURE ESWAB REG 1ML (MISCELLANEOUS) ×2 IMPLANT
SYR CONTROL 10ML LL (SYRINGE) ×2 IMPLANT
TOWEL GREEN STERILE (TOWEL DISPOSABLE) ×2 IMPLANT
TOWEL GREEN STERILE FF (TOWEL DISPOSABLE) ×2 IMPLANT
TUBE CONNECTING 12X1/4 (SUCTIONS) ×2 IMPLANT
UNDERPAD 30X36 HEAVY ABSORB (UNDERPADS AND DIAPERS) ×2 IMPLANT
YANKAUER SUCT BULB TIP NO VENT (SUCTIONS) ×2 IMPLANT

## 2021-09-19 NOTE — Brief Op Note (Signed)
09/19/2021  9:39 PM  PATIENT:  Joel Kelly  36 y.o. male  PRE-OPERATIVE DIAGNOSIS:  Right Elbow Infection/Bursitis  POST-OPERATIVE DIAGNOSIS:  Right Elbow Infection/Bursitis  PROCEDURE:  Procedure(s): IRRIGATION AND DEBRIDEMENT RIGHT ELBOW (Right)  SURGEON:  Surgeon(s) and Role:    Dairl Ponder, MD - Primary  PHYSICIAN ASSISTANT:   ASSISTANTS: none   ANESTHESIA:   general  EBL:  20 mL   BLOOD ADMINISTERED:none  DRAINS: Half-inch iodoform gauze packed in wound  LOCAL MEDICATIONS USED:  MARCAINE     SPECIMEN:  Biopsy / Limited Resection  DISPOSITION OF SPECIMEN:  PATHOLOGY  COUNTS:  YES  TOURNIQUET:   Total Tourniquet Time Documented: Upper Arm (Right) - 27 minutes Total: Upper Arm (Right) - 27 minutes   DICTATION: .Joel Kelly Dictation  PLAN OF CARE: Admit for overnight observation  PATIENT DISPOSITION:  PACU - hemodynamically stable.   Delay start of Pharmacological VTE agent (>24hrs) due to surgical blood loss or risk of bleeding: not applicable

## 2021-09-19 NOTE — Progress Notes (Signed)
Patient underwent incision and drainage and excision of infected olecranon bursa right elbow.  Patient had wound loosely closed over half intraoperative form gauze.  Patient's white count was 11.2 preop.  Okay to start antibiotics as per medicine service.  Patient will be discharge at their discretion when white count normalizes.  I will need to see patient in my office this Friday for dressing change, packing removal, and whirlpool as necessary.  Please call [phone] for any questions or concerns.

## 2021-09-19 NOTE — Anesthesia Procedure Notes (Addendum)
Procedure Name: LMA Insertion Date/Time: 09/19/2021 9:00 PM  Performed by: Adair Laundry, CRNAPre-anesthesia Checklist: Patient identified, Emergency Drugs available, Suction available and Patient being monitored Patient Re-evaluated:Patient Re-evaluated prior to induction Oxygen Delivery Method: Circle system utilized Preoxygenation: Pre-oxygenation with 100% oxygen Induction Type: IV induction Ventilation: Mask ventilation without difficulty LMA: LMA inserted LMA Size: 4.0 Number of attempts: 1 Airway Equipment and Method: Bite block Placement Confirmation: positive ETCO2 Tube secured with: Tape Dental Injury: Teeth and Oropharynx as per pre-operative assessment

## 2021-09-19 NOTE — Anesthesia Preprocedure Evaluation (Signed)
Anesthesia Evaluation  Patient identified by MRN, date of birth, ID band Patient awake    Reviewed: Allergy & Precautions, NPO status , Patient's Chart, lab work & pertinent test results  Airway Mallampati: II  TM Distance: >3 FB Neck ROM: Full    Dental  (+) Dental Advisory Given   Pulmonary asthma , former smoker,    breath sounds clear to auscultation       Cardiovascular negative cardio ROS   Rhythm:Regular Rate:Normal     Neuro/Psych negative neurological ROS     GI/Hepatic Neg liver ROS, GERD  ,  Endo/Other  negative endocrine ROS  Renal/GU negative Renal ROS     Musculoskeletal   Abdominal   Peds  Hematology negative hematology ROS (+)   Anesthesia Other Findings   Reproductive/Obstetrics                             Anesthesia Physical Anesthesia Plan  ASA: 2  Anesthesia Plan: General   Post-op Pain Management: Minimal or no pain anticipated and Toradol IV (intra-op)*   Induction: Intravenous  PONV Risk Score and Plan: 2 and Dexamethasone, Ondansetron, Midazolam and Treatment may vary due to age or medical condition  Airway Management Planned: Oral ETT  Additional Equipment:   Intra-op Plan:   Post-operative Plan: Extubation in OR  Informed Consent: I have reviewed the patients History and Physical, chart, labs and discussed the procedure including the risks, benefits and alternatives for the proposed anesthesia with the patient or authorized representative who has indicated his/her understanding and acceptance.     Dental advisory given  Plan Discussed with: CRNA  Anesthesia Plan Comments:         Anesthesia Quick Evaluation

## 2021-09-19 NOTE — Op Note (Signed)
Patient was taken the operating suite and after induction of adequate general anesthetic the right upper extremity was prepped and draped in the usual sterile fashion.  An Esmarch was used to exsanguinate the limb and the tourniquet was inflated 250 mmHg.  At this point time a posterior incision was made to the elbow on the right over a obviously infected olecranon bursa.  Dissection was carried down through skin subtenons tissues for distance of 6 to 8 cm and we carefully identified the inflamed and infected olecranon bursa.  We carefully excised this since entirety.  The contents were cultured for aerobic, anaerobic and a stat Gram stain.  We extended our incision proximally distally and subcutaneously dissected to reveal edematous type fluid but no frank purulence.  We then thoroughly irrigated the wound with 3 L of normal saline.  We then used 3-0 nylon to loosely close the wound over half-inch iodoform gauze and leaving a small 1 cm opening.  We then injected with 20 cc of quarter percent plain Marcaine.  We then dressed with Xeroform, 4 x 4's, and a compressive bandage to include ABDs and compression bandage and Ace wrap's.  The patient tolerated this procedure well went recovery in stable fashion.

## 2021-09-19 NOTE — H&P (Signed)
Joel Kelly is an 36 y.o. male.   Chief Complaint: Right elbow pain, swelling, and drainage with MRI consistent with a infected olecranon bursa HPI: Patient is a very pleasant 36 year old male with a history of pain and swelling in his elbow on the right for several days now with increasing swelling and drainage and an MRI showing a infected olecranon bursa.  Patient has a white count of 11.2.  Past Medical History:  Diagnosis Date   Asthma    GERD (gastroesophageal reflux disease)    TUMS as needed   Lateral meniscal tear 10/2015   right knee   Loose body of right knee 10/2015    Past Surgical History:  Procedure Laterality Date   KNEE ARTHROSCOPY W/ ACL RECONSTRUCTION Right 10/14/2009   KNEE ARTHROSCOPY W/ ACL RECONSTRUCTION Left    KNEE ARTHROSCOPY W/ ACL RECONSTRUCTION Bilateral    2018   KNEE ARTHROSCOPY WITH LATERAL MENISECTOMY Right 11/02/2015   Procedure: Right KNEE ARTHROSCOPY WITH LATERAL MENISCECTOMY and removal of foreign body, microfracture, chondroplasty;  Surgeon: Salvatore Marvel, MD;  Location: [facility];  Service: Orthopedics;  Laterality: Right;  Right KNEE ARTHROSCOPY WITH LATERAL MENISCECTOMY and removal of foreign body, microfracture, chondroplasty   KNEE ARTHROSCOPY WITH MENISCAL REPAIR Right     History reviewed. No pertinent family history. Social History:  reports that he has quit smoking. His smoking use included cigarettes. His smokeless tobacco use includes chew. He reports current alcohol use. He reports that he does not use drugs.  Allergies: No Known Allergies  Medications Prior to Admission  Medication Sig Dispense Refill   ibuprofen (ADVIL) 200 MG tablet Take 600 mg by mouth every 6 (six) hours as needed.     fluticasone furoate-vilanterol (BREO ELLIPTA) 100-25 MCG/ACT AEPB Inhale 1 puff into the lungs daily. 60 each 2   fluticasone furoate-vilanterol (BREO ELLIPTA) 200-25 MCG/ACT AEPB Inhale 1 puff into the lungs daily. 60 each 2     Results for orders placed or performed during the hospital encounter of 09/19/21 (from the past 48 hour(s))  CBC     Status: Abnormal   Collection Time: 09/19/21  3:56 PM  Result Value Ref Range   WBC 11.4 (H) 4.0 - 10.5 K/uL   RBC 5.20 4.22 - 5.81 MIL/uL   Hemoglobin 15.9 13.0 - 17.0 g/dL   HCT 94.7 09.6 - 28.3 %   MCV 90.2 80.0 - 100.0 fL   MCH 30.6 26.0 - 34.0 pg   MCHC 33.9 30.0 - 36.0 g/dL   RDW 66.2 94.7 - 65.4 %   Platelets 270 150 - 400 K/uL   nRBC 0.0 0.0 - 0.2 %    Comment: Performed at [facility], [address], [place], [place] [address]   No results found.  Review of Systems  All other systems reviewed and are negative.   Blood pressure 103/73, pulse (!) 101, temperature 98.7 F (37.1 C), resp. rate 17, height 5\' 10"  (1.778 m), weight 99.8 kg, SpO2 96 %. Physical Exam Constitutional:      Appearance: Normal appearance.  HENT:     Head: Normocephalic and atraumatic.  Eyes:     Pupils: Pupils are equal, round, and reactive to light.  Cardiovascular:     Rate and Rhythm: Normal rate.  Pulmonary:     Effort: Pulmonary effort is normal.  Musculoskeletal:     Right elbow: Swelling and effusion present. Decreased range of motion. Tenderness present.     Cervical back: Normal range  of motion.     Comments: Right elbow pain and swelling and MRI positive for olecranon bursitis  Skin:    General: Skin is warm.  Neurological:     Mental Status: He is alert and oriented to person, place, and time.  Psychiatric:        Mood and Affect: Mood normal.        Behavior: Behavior normal.        Thought Content: Thought content normal.        Judgment: Judgment normal.      Assessment/Plan 36 year old male with infected right elbow olecranon bursitis.  Have discussed the role of incision and drainage with open packing and possible secondary wound closure.  Patient will be admitted overnight to the medicine service with IV antibiotics until white count  normalizes.  Patient understands risk benefits and wishes to proceed  Marlowe Shores, MD 09/19/2021, 8:41 PM

## 2021-09-19 NOTE — Transfer of Care (Signed)
Immediate Anesthesia Transfer of Care Note  Patient: Joel Kelly  Procedure(s) Performed: IRRIGATION AND DEBRIDEMENT RIGHT ELBOW (Right: Elbow)  Patient Location: PACU  Anesthesia Type:General  Level of Consciousness: awake, alert  and oriented  Airway & Oxygen Therapy: Patient Spontanous Breathing and Patient connected to nasal cannula oxygen  Post-op Assessment: Report given to RN and Post -op Vital signs reviewed and stable  Post vital signs: Reviewed and stable  Last Vitals:  Vitals Value Taken Time  BP 120/68   Temp    Pulse 108   Resp 14   SpO2 94     Last Pain: There were no vitals filed for this visit.       Complications: No notable events documented.

## 2021-09-20 ENCOUNTER — Encounter (HOSPITAL_COMMUNITY): Payer: Self-pay | Admitting: Orthopedic Surgery

## 2021-09-20 DIAGNOSIS — D72829 Elevated white blood cell count, unspecified: Secondary | ICD-10-CM | POA: Diagnosis present

## 2021-09-20 DIAGNOSIS — M7021 Olecranon bursitis, right elbow: Secondary | ICD-10-CM | POA: Diagnosis not present

## 2021-09-20 DIAGNOSIS — M71121 Other infective bursitis, right elbow: Secondary | ICD-10-CM | POA: Diagnosis not present

## 2021-09-20 DIAGNOSIS — L03113 Cellulitis of right upper limb: Secondary | ICD-10-CM | POA: Diagnosis not present

## 2021-09-20 DIAGNOSIS — J4540 Moderate persistent asthma, uncomplicated: Secondary | ICD-10-CM | POA: Diagnosis not present

## 2021-09-20 LAB — CBC WITH DIFFERENTIAL/PLATELET
Abs Immature Granulocytes: 0.02 10*3/uL (ref 0.00–0.07)
Basophils Absolute: 0 10*3/uL (ref 0.0–0.1)
Basophils Relative: 0 %
Eosinophils Absolute: 0 10*3/uL (ref 0.0–0.5)
Eosinophils Relative: 0 %
HCT: 45.1 % (ref 39.0–52.0)
Hemoglobin: 15.8 g/dL (ref 13.0–17.0)
Immature Granulocytes: 0 %
Lymphocytes Relative: 11 %
Lymphs Abs: 1.2 10*3/uL (ref 0.7–4.0)
MCH: 31 pg (ref 26.0–34.0)
MCHC: 35 g/dL (ref 30.0–36.0)
MCV: 88.6 fL (ref 80.0–100.0)
Monocytes Absolute: 0.4 10*3/uL (ref 0.1–1.0)
Monocytes Relative: 4 %
Neutro Abs: 9 10*3/uL — ABNORMAL HIGH (ref 1.7–7.7)
Neutrophils Relative %: 85 %
Platelets: 284 10*3/uL (ref 150–400)
RBC: 5.09 MIL/uL (ref 4.22–5.81)
RDW: 12.4 % (ref 11.5–15.5)
WBC: 10.6 10*3/uL — ABNORMAL HIGH (ref 4.0–10.5)
nRBC: 0 % (ref 0.0–0.2)

## 2021-09-20 LAB — BASIC METABOLIC PANEL
Anion gap: 9 (ref 5–15)
BUN: 15 mg/dL (ref 6–20)
CO2: 23 mmol/L (ref 22–32)
Calcium: 9.2 mg/dL (ref 8.9–10.3)
Chloride: 104 mmol/L (ref 98–111)
Creatinine, Ser: 1.18 mg/dL (ref 0.61–1.24)
GFR, Estimated: >60 mL/min (ref >60)
Glucose, Bld: 133 mg/dL — ABNORMAL HIGH (ref 70–99)
Potassium: 4.2 mmol/L (ref 3.5–5.1)
Sodium: 136 mmol/L (ref 135–145)

## 2021-09-20 MED ORDER — OXYCODONE-ACETAMINOPHEN 5-325 MG PO TABS
2.0000 | ORAL_TABLET | Freq: Four times a day (QID) | ORAL | Status: DC | PRN
  Administered 2021-09-20 – 2021-09-21 (×3): 2 via ORAL
  Filled 2021-09-20 (×4): qty 2

## 2021-09-20 MED ORDER — HYDROMORPHONE HCL 1 MG/ML IJ SOLN
1.0000 mg | Freq: Once | INTRAMUSCULAR | Status: AC
  Administered 2021-09-20: 1 mg via INTRAVENOUS
  Filled 2021-09-20: qty 1

## 2021-09-20 MED ORDER — HYDROMORPHONE HCL 1 MG/ML IJ SOLN
0.5000 mg | INTRAMUSCULAR | Status: DC | PRN
  Administered 2021-09-20 – 2021-09-21 (×8): 0.5 mg via INTRAVENOUS
  Filled 2021-09-20 (×7): qty 0.5

## 2021-09-20 MED ORDER — KETOROLAC TROMETHAMINE 15 MG/ML IJ SOLN
15.0000 mg | Freq: Once | INTRAMUSCULAR | Status: AC
  Administered 2021-09-20: 15 mg via INTRAVENOUS
  Filled 2021-09-20: qty 1

## 2021-09-20 NOTE — Plan of Care (Signed)
  Problem: Clinical Measurements: Goal: Ability to maintain clinical measurements within normal limits will improve Outcome: Progressing Goal: Will remain free from infection Outcome: Progressing Goal: Diagnostic test results will improve Outcome: Progressing Goal: Respiratory complications will improve Outcome: Progressing Goal: Cardiovascular complication will be avoided Outcome: Progressing   Problem: Elimination: Goal: Will not experience complications related to bowel motility Outcome: Progressing Goal: Will not experience complications related to urinary retention Outcome: Progressing   Problem: Nutrition: Goal: Adequate nutrition will be maintained Outcome: Progressing

## 2021-09-20 NOTE — Progress Notes (Signed)
PROGRESS NOTE    Joel Eltzroth  YJE:[id number] DOB: March 07, 1985 DOA: 09/19/2021 PCP: Patient, No Pcp Per   Brief Narrative: Joel Kelly is a 36 y.o. male with a history of asthma. Patient presented secondary to right elbow pain and found to have right olecranon bursitis with associated cellulitis. Empiric antibiotics initiated. Orthopedic surgery consulted for concern of infected olecranon bursitis. I&D performed on 7/18. Wound cultures pending.   Assessment and Plan:  Infected right olecranon bursitis Right olecranon cellulitis Patient started empirically on Ceftriaxone. Orthopedic surgery consulted and performed I&D on 7/18 which was significant for no frank purulence; wound culture obtained and is pending. Leukocytosis improved. -Continue Ceftriaxone IV -Follow-up wound culture results  Moderate persistent asthma Stable. -Continue Breo Ellipta and Albuterol PRN  DVT prophylaxis: SCDs Code Status:   Code Status: Full Code Family Communication: Wife at bedside Disposition Plan: Discharge home tomorrow pending wound culture results and ability to transition to oral antibiotics   Consultants:  Orthopedic surgery  Procedures:  I&D (7/18)  Antimicrobials: Ceftriaxone    Subjective: Patient reports continued right elbow pain, different than prior to surgery. No other concerns.  Objective: BP 114/69 (BP Location: Left Arm)   Pulse 84   Temp 97.6 F (36.4 C) (Oral)   Resp 18   Ht 5\' 10"  (1.778 m)   Wt 99.8 kg   SpO2 95%   BMI 31.57 kg/m   Examination:  General exam: Appears calm and comfortable Respiratory system: Clear to auscultation. Respiratory effort normal. Cardiovascular system: S1 & S2 heard, RRR. No murmurs, rubs, gallops or clicks. Gastrointestinal system: Abdomen is nondistended, soft and nontender. Normal bowel sounds heard. Central nervous system: Alert and oriented. No focal neurological deficits. Musculoskeletal: No edema. No calf tenderness.  Right arm in surgical dressing. Skin: No cyanosis. No rashes Psychiatry: Judgement and insight appear normal. Mood & affect appropriate.    Data Reviewed: I have personally reviewed following labs and imaging studies  CBC Lab Results  Component Value Date   WBC 10.6 (H) 09/20/2021   RBC 5.09 09/20/2021   HGB 15.8 09/20/2021   HCT 45.1 09/20/2021   MCV 88.6 09/20/2021   MCH 31.0 09/20/2021   PLT 284 09/20/2021   MCHC 35.0 09/20/2021   RDW 12.4 09/20/2021   LYMPHSABS 1.2 09/20/2021   MONOABS 0.4 09/20/2021   EOSABS 0.0 09/20/2021   BASOSABS 0.0 09/20/2021     Last metabolic panel Lab Results  Component Value Date   NA 136 09/20/2021   K 4.2 09/20/2021   CL 104 09/20/2021   CO2 23 09/20/2021   BUN 15 09/20/2021   CREATININE 1.18 09/20/2021   GLUCOSE 133 (H) 09/20/2021   GFRNONAA >60 09/20/2021   GFRAA >60 01/08/2019   CALCIUM 9.2 09/20/2021   PROT 7.5 10/14/2009   ALBUMIN 4.6 10/14/2009   BILITOT 0.8 10/14/2009   ALKPHOS 68 10/14/2009   AST 36 10/14/2009   ALT 27 10/14/2009   ANIONGAP 9 09/20/2021    GFR: Estimated Creatinine Clearance: 101.5 mL/min (by C-G formula based on SCr of 1.18 mg/dL).  Recent Results (from the past 240 hour(s))  Aerobic/Anaerobic Culture w Gram Stain (surgical/deep wound)     Status: None (Preliminary result)   Collection Time: 09/19/21  9:11 PM   Specimen: Abscess  Result Value Ref Range Status   Specimen Description ABSCESS  Final   Special Requests RT ELBOW  Final   Gram Stain   Final    NO SQUAMOUS EPITHELIAL CELLS SEEN NO WBC SEEN  NO ORGANISMS SEEN    Culture   Final    NO GROWTH < 12 HOURS Performed at [facility], [address] [place], [place] [address]    Report Status PENDING  Incomplete      Radiology Studies: No results found.    LOS: 1 day    Jacquelin Hawking, MD [facility] 09/20/2021, 6:00 PM   If 7PM-7AM, please contact night-coverage [email or web address]

## 2021-09-20 NOTE — Anesthesia Postprocedure Evaluation (Signed)
Anesthesia Post Note  Patient: [place]  Procedure(s) Performed: IRRIGATION AND DEBRIDEMENT RIGHT ELBOW (Right: Elbow)     Patient location during evaluation: PACU Anesthesia Type: General Level of consciousness: awake and alert Pain management: pain level controlled Vital Signs Assessment: post-procedure vital signs reviewed and stable Respiratory status: spontaneous breathing, nonlabored ventilation, respiratory function stable and patient connected to nasal cannula oxygen Cardiovascular status: blood pressure returned to baseline and stable Postop Assessment: no apparent nausea or vomiting Anesthetic complications: no   No notable events documented.  Last Vitals:  Vitals:   09/19/21 2256 09/20/21 0049  BP: (!) 111/54 135/80  Pulse: 97 94  Resp: 17 16  Temp: 37.6 C (!) 36.3 C  SpO2: 94% 92%    Last Pain:  Vitals:   09/20/21 0049  TempSrc: Oral  PainSc:                  Kennieth Rad

## 2021-09-20 NOTE — Progress Notes (Signed)
Had to reinforce dressing x2 overnight due to bloody drainage. Provider aware.

## 2021-09-20 NOTE — H&P (Signed)
History and Physical    PLEASE NOTE THAT DRAGON DICTATION SOFTWARE WAS USED IN THE CONSTRUCTION OF THIS NOTE.   Joel Kelly EVO:[id number] DOB: January 11, 1986 DOA: 09/19/2021  PCP: Patient, No Pcp Per  (will further assess) Patient coming from: home   I have personally briefly reviewed patient's old medical records in [place]  Chief Complaint: Right elbow pain  HPI: Joel Kelly is a 36 y.o. male with medical history significant for moderate persistent asthma, who is admitted to [facility] on 09/19/2021 with infected right olecranon bursitis with associated cellulitis as a direct admit from the PACU status post I&D by orthopedic surgery.   In the setting of the patient's report to new onset pain, swelling, erythema associated with the right elbow over the last few days, the patient was referred to outpatient orthopedic surgery, and subsequently underwent MRI of the right elbow, with findings consistent with infected olecranon bursa.  Patient also noted some mild nonpurulent drainage from the right elbow, in the absence of a associated subjective fever, chills, rigors, or generalized myalgias.  Preoperative labs were notable for CBC earlier this afternoon, showing elevation in white blood cell count to 11,400.  Patient subsequently taken to the OR at [facility] by Dr. Burney Gauze of orthopedic surgery, who performed I&D of the right olecranon bursa with irrigation followed by open packing and dressing, with procedure performed under general anesthesia.  Associated specimen obtained intraoperatively from right olecranon bursa was sent for Gram stain and aerobic/anaerobic culture.  No reported overt intraoperative complications.  Patient recovered in PACU.   I was contacted by Dr. Burney Gauze postoperatively, with request to admit the patient to the hospital service from the PACU for further evaluation and management of the patient's infected right olecranon bursitis with associated  cellulitis, with consideration for overnight IV antibiotics.  Dr. Burney Gauze conveyed that from his standpoint, discharged to home tomorrow would be okay so long as there was interval improvement in his preoperative leukocytosis.  He conveyed the need for the patient to follow-up in his clinic on Friday, 09/22/2021 for dressing change and packing removal.   Postoperatively, the patient reports mild residual discomfort of the right elbow, but notes significant interval improvement relative to that which he was experiencing preoperatively.  Denies any acute focal numbness or paresthesias. denies any associated subjective fever, chills, rigors, generalized myalgias.  No recent chest pain, shortness of breath, cough.  Aside from mild erythema associate with the right elbow, denies any additional rash.  No recent dysuria or gross hematuria.  No abdominal discomfort, diarrhea.  Medical history notable for moderate persistent asthma.  Per chart review, most recent metabolic panel was performed in November 2020, with creatinine at that time noted to be 1.02.    Postoperative vital signs following recovery in the PACU noted as follows: Temperature 98.5; heart rate 77; blood pressure 120/67; respiratory rate 19, oxygen saturation 94% on room air.   Subsequently, I admitted the patient for overnight observation for further evaluation management of infected right olecranon and bursitis with associated cellulitis.       Review of Systems: As per HPI otherwise 10 point review of systems negative.   Past Medical History:  Diagnosis Date   Asthma    GERD (gastroesophageal reflux disease)    TUMS as needed   Lateral meniscal tear 10/2015   right knee   Loose body of right knee 10/2015    Past Surgical History:  Procedure Laterality Date   KNEE ARTHROSCOPY W/ ACL  RECONSTRUCTION Right 10/14/2009   KNEE ARTHROSCOPY W/ ACL RECONSTRUCTION Left    KNEE ARTHROSCOPY W/ ACL RECONSTRUCTION Bilateral    2018    KNEE ARTHROSCOPY WITH LATERAL MENISECTOMY Right 11/02/2015   Procedure: Right KNEE ARTHROSCOPY WITH LATERAL MENISCECTOMY and removal of foreign body, microfracture, chondroplasty;  Surgeon: Elsie Saas, MD;  Location: [place];  Service: Orthopedics;  Laterality: Right;  Right KNEE ARTHROSCOPY WITH LATERAL MENISCECTOMY and removal of foreign body, microfracture, chondroplasty   KNEE ARTHROSCOPY WITH MENISCAL REPAIR Right     Social History:  reports that he has quit smoking. His smoking use included cigarettes. His smokeless tobacco use includes chew. He reports current alcohol use. He reports that he does not use drugs.   No Known Allergies  History reviewed. No pertinent family history.   Prior to Admission medications   Medication Sig Start Date End Date Taking? Authorizing Provider  ibuprofen (ADVIL) 200 MG tablet Take 600 mg by mouth every 6 (six) hours as needed.   Yes [provider]  fluticasone furoate-vilanterol (BREO ELLIPTA) 100-25 MCG/ACT AEPB Inhale 1 puff into the lungs daily. 04/14/21   Mannam, Hart Robinsons, MD  fluticasone furoate-vilanterol (BREO ELLIPTA) 200-25 MCG/ACT AEPB Inhale 1 puff into the lungs daily. 02/03/21   Marshell Garfinkel, MD     Objective    Physical Exam: Vitals:   09/19/21 2220 09/19/21 2235 09/19/21 2240 09/19/21 2256  BP: 119/81 118/75 120/67 (!) 111/54  Pulse: 76 77 83 97  Resp: (!) 23 19 (!) 23 17  Temp:   98.5 F (36.9 C) 99.6 F (37.6 C)  TempSrc:    Oral  SpO2: 92% 94% 93% 94%  Weight:      Height:        General: appears to be stated age; alert, oriented Skin: warm, dry; mild erythema associated with the right elbow Head:  AT/[place] Mouth:  Oral mucosa membranes appear moist, normal dentition Neck: supple; trachea midline Heart:  RRR; did not appreciate any M/R/G Lungs: CTAB, did not appreciate any wheezes, rales, or rhonchi Abdomen: + BS; soft, ND, NT Vascular: 2+ pedal pulses b/l; 2+ radial pulses  b/l Extremities: no muscle wasting; mild erythema, swelling associated with right elbow Neuro: strength and sensation intact in upper and lower extremities b/l    Labs on Admission: I have personally reviewed following labs and imaging studies  CBC: Recent Labs  Lab 09/19/21 1556  WBC 11.4*  HGB 15.9  HCT 46.9  MCV 90.2  PLT 093   Basic Metabolic Panel: No results for input(s): "NA", "K", "CL", "CO2", "GLUCOSE", "BUN", "CREATININE", "CALCIUM", "MG", "PHOS" in the last 168 hours. GFR: CrCl cannot be calculated (Patient's most recent lab result is older than the maximum 21 days allowed.). Liver Function Tests: No results for input(s): "AST", "ALT", "ALKPHOS", "BILITOT", "PROT", "ALBUMIN" in the last 168 hours. No results for input(s): "LIPASE", "AMYLASE" in the last 168 hours. No results for input(s): "AMMONIA" in the last 168 hours. Coagulation Profile: No results for input(s): "INR", "PROTIME" in the last 168 hours. Cardiac Enzymes: No results for input(s): "CKTOTAL", "CKMB", "CKMBINDEX", "TROPONINI" in the last 168 hours. BNP (last 3 results) No results for input(s): "PROBNP" in the last 8760 hours. HbA1C: No results for input(s): "HGBA1C" in the last 72 hours. CBG: No results for input(s): "GLUCAP" in the last 168 hours. Lipid Profile: No results for input(s): "CHOL", "HDL", "LDLCALC", "TRIG", "CHOLHDL", "LDLDIRECT" in the last 72 hours. Thyroid Function Tests: No results for input(s): "TSH", "T4TOTAL", "  FREET4", "T3FREE", "THYROIDAB" in the last 72 hours. Anemia Panel: No results for input(s): "VITAMINB12", "FOLATE", "FERRITIN", "TIBC", "IRON", "RETICCTPCT" in the last 72 hours. Urine analysis: No results found for: "COLORURINE", "APPEARANCEUR", "LABSPEC", "PHURINE", "GLUCOSEU", "HGBUR", "BILIRUBINUR", "KETONESUR", "PROTEINUR", "UROBILINOGEN", "NITRITE", "LEUKOCYTESUR"  Radiological Exams on Admission: No results found.    Assessment/Plan   Principal Problem:    Infected olecranon bursa, right Active Problems:   Moderate persistent asthma   Status post surgery   Cellulitis of right upper extremity   Leukocytosis       #) Infected right olecranon bursitis with cellulitis: New onset right elbow discomfort associate with erythema, swelling, mild serous drainage, with outpatient MRI reportedly showing findings consistent with infected right olecranon bursitis, status post I&D with open packing and dressing performed by Dr. Burney Gauze on 09/19/2021.  Intraoperative specimen sent for Gram stain cultures, with associated results  currently pending.  Preoperative CBC demonstrated mild leukocytosis, with review of ensuing vital signs showing no evidence of additional SIRS criteria.  Therefore, criteria not currently met for sepsis. Dr. Recommended consideration for IV antibiotics and is amenable to the patient being discharged tomorrow so long as there is interval improvement in leukocytosis, with close follow-up in orthopedic surgery clinic to occur on Friday, 09/22/2021 for dressing change and packing removal, as above.  Orthopedic surgery to sign off at this time, but are available for any additional questions.   In the setting of the presence of a single SIRS criteria, the patient's cellulitis meets criteria to be considered moderate in severity.  As outlined by antibiotic stewardship committee, moderate cellulitis that is nonpurulent in nature, reports empiric IV antibiotic coverage in the form of Rocephin.  Will initiate this IV antibiotic at the present time, closely monitor for ensuing results associated Gram stain/culture.  Right upper extremity appears neurovascular intact at this time.     Plan: Start Rocephin.  Follow-up results intraoperative Gram stain/culture, as above.  Repeat CBC differential in the morning.  As needed Percocet.  Prn IV fentanyl, pain refractory Percocet.  Check BMP.  Prn acetaminophen for fever.  Anticipate discharge home on  09/20/2021, so long as interval improvement in leukocytosis, with follow-up in orthopedic surgery clinic with Dr. Burney Gauze on Friday, 09/22/2021, for dressing change and packing removal, as above.        #) Moderate persistent asthma: Documented history of such, without evidence of acute exacerbation at this time.  On scheduled Breo Ellipta as an outpatient.  Plan: Continue outpatient Breo Ellipta.  Prn albuterol nebulizer.     DVT prophylaxis: SCD's   Code Status: Full code Family Communication: none Disposition Plan: Per Rounding Team Consults called: I discussed the patient's case with Dr. Burney Gauze of orthopedic surgery, as further detailed above;  Admission status: Observation    PLEASE NOTE THAT DRAGON DICTATION SOFTWARE WAS USED IN THE CONSTRUCTION OF THIS NOTE.   [place] DO [facility]  From [place]   09/20/2021, 12:12 AM

## 2021-09-21 DIAGNOSIS — M71121 Other infective bursitis, right elbow: Secondary | ICD-10-CM | POA: Diagnosis not present

## 2021-09-21 DIAGNOSIS — M7021 Olecranon bursitis, right elbow: Secondary | ICD-10-CM | POA: Diagnosis not present

## 2021-09-21 LAB — CBC
HCT: 39.9 % (ref 39.0–52.0)
Hemoglobin: 13.4 g/dL (ref 13.0–17.0)
MCH: 30.7 pg (ref 26.0–34.0)
MCHC: 33.6 g/dL (ref 30.0–36.0)
MCV: 91.3 fL (ref 80.0–100.0)
Platelets: 236 10*3/uL (ref 150–400)
RBC: 4.37 MIL/uL (ref 4.22–5.81)
RDW: 12.5 % (ref 11.5–15.5)
WBC: 10.2 10*3/uL (ref 4.0–10.5)
nRBC: 0 % (ref 0.0–0.2)

## 2021-09-21 LAB — SURGICAL PATHOLOGY

## 2021-09-21 MED ORDER — CEFADROXIL 500 MG PO CAPS: 500.0000 mg | ORAL_CAPSULE | Freq: Two times a day (BID) | ORAL | 0 refills | Status: AC

## 2021-09-21 MED ORDER — OXYCODONE-ACETAMINOPHEN 5-325 MG PO TABS: 1.0000 | ORAL_TABLET | Freq: Four times a day (QID) | ORAL | 0 refills | Status: DC | PRN

## 2021-09-21 NOTE — Discharge Summary (Signed)
Physician Discharge Summary   Patient: Joel Kelly MRN: [id number] DOB: 1985-07-14  Admit date:     09/19/2021  Discharge date: 09/21/21  Discharge Physician: Jacquelin Hawking, MD   PCP: Patient, No Pcp Per   Recommendations at discharge:  Follow-up with PCP and orthopedic surgery Final wound culture results  Discharge Diagnoses: Principal Problem:   Infected olecranon bursa, right Active Problems:   Moderate persistent asthma   Status post surgery   Cellulitis of right upper extremity   Leukocytosis  Resolved Problems:   * No resolved hospital problems. *  Hospital Course: Joel Thomass is a 36 y.o. male with a history of asthma. Patient presented secondary to right elbow pain and found to have right olecranon bursitis with associated cellulitis. Empiric antibiotics initiated. Orthopedic surgery consulted for concern of infected olecranon bursitis. I&D performed on 7/18. Patient transitioned to Cefadroxil to complete a 7 day course of antibiotics. Wound cultures pending on discharge but preliminary results show Staph Aureus.  Assessment and Plan: Infected right olecranon bursitis Right olecranon cellulitis Patient started empirically on Ceftriaxone. Orthopedic surgery consulted and performed I&D on 7/18 which was significant for no frank purulence; wound culture obtained and is pending. Leukocytosis resolved. Transition from Ceftriaxone to Cefadroxil on discharge.   Moderate persistent asthma Stable. Continue Breo Ellipta and Albuterol PRN   Consultants: Orthopedic surgery Procedures performed: I&D (7/18)  Disposition: Home Diet recommendation: Regular diet   DISCHARGE MEDICATION: Allergies as of 09/21/2021   No Known Allergies      Medication List     TAKE these medications    anastrozole 1 MG tablet Commonly known as: ARIMIDEX Take 1 mg by mouth once a week. Tuesday   cefadroxil 500 MG capsule Commonly known as: DURICEF Take 1 capsule (500 mg total) by mouth  2 (two) times daily for 5 days.   fluticasone 50 MCG/ACT nasal spray Commonly known as: FLONASE Place 1 spray into both nostrils daily as needed for allergies.   fluticasone furoate-vilanterol 200-25 MCG/ACT Aepb Commonly known as: Breo Ellipta Inhale 1 puff into the lungs daily. What changed:  when to take this reasons to take this Another medication with the same name was removed. Continue taking this medication, and follow the directions you see here.   ibuprofen 200 MG tablet Commonly known as: ADVIL Take 600 mg by mouth every 6 (six) hours as needed for mild pain.   oxyCODONE-acetaminophen 5-325 MG tablet Commonly known as: PERCOCET/ROXICET Take 1-2 tablets by mouth every 6 (six) hours as needed for moderate pain.   spironolactone 25 MG tablet Commonly known as: ALDACTONE Take 25 mg by mouth daily.        Follow-up Information     Dairl Ponder, MD Follow up on 09/22/2021.   Specialty: Orthopedic Surgery Why: For wound re-check.  Patient will come to my office for wound change, packing removal, and whirlpool as necessary Contact information: [address] [place] [address] [phone]                Discharge Exam: BP (!) 107/44 (BP Location: Left Arm)   Pulse 82   Temp 98.3 F (36.8 C) (Oral)   Resp 17   Ht 5\' 10"  (1.778 m)   Wt 105.1 kg   SpO2 96%   BMI 33.25 kg/m   General exam: Appears calm and comfortable Respiratory system: Clear to auscultation. Respiratory effort normal. Cardiovascular system: S1 & S2 heard, RRR. No murmurs, rubs, gallops or clicks. Gastrointestinal system: Abdomen is nondistended, soft  and nontender. Normal bowel sounds heard. Central nervous system: Alert and oriented. No focal neurological deficits. Musculoskeletal: No edema. No calf tenderness. Right arm in post-op dressing Skin: No cyanosis. No rashes Psychiatry: Judgement and insight appear normal. Mood & affect appropriate.   Condition at discharge:  good  The results of significant diagnostics from this hospitalization (including imaging, microbiology, ancillary and laboratory) are listed below for reference.   Imaging Studies: No results found.  Microbiology: Results for orders placed or performed during the hospital encounter of 09/19/21  Aerobic/Anaerobic Culture w Gram Stain (surgical/deep wound)     Status: None (Preliminary result)   Collection Time: 09/19/21  9:11 PM   Specimen: Abscess  Result Value Ref Range Status   Specimen Description ABSCESS  Final   Special Requests RT ELBOW  Final   Gram Stain   Final    NO SQUAMOUS EPITHELIAL CELLS SEEN NO WBC SEEN NO ORGANISMS SEEN    Culture   Final    FEW STAPHYLOCOCCUS AUREUS SUSCEPTIBILITIES TO FOLLOW Performed at [facility], [address] [place], [place] [address]    Report Status PENDING  Incomplete    Labs: CBC: Recent Labs  Lab 09/19/21 1556 09/20/21 0129 09/21/21 0824  WBC 11.4* 10.6* 10.2  NEUTROABS  --  9.0*  --   HGB 15.9 15.8 13.4  HCT 46.9 45.1 39.9  MCV 90.2 88.6 91.3  PLT 270 284 236   Basic Metabolic Panel: Recent Labs  Lab 09/20/21 0129  NA 136  K 4.2  CL 104  CO2 23  GLUCOSE 133*  BUN 15  CREATININE 1.18  CALCIUM 9.2    Discharge time spent: 35 minutes.  Signed: Jacquelin Hawking, MD [facility] 09/21/2021

## 2021-09-21 NOTE — Progress Notes (Signed)
Pt stable for discharge, Educated on antibiotic therapy, pain control and follow up appointments. Pt verbalizes understanding of instructions.Pt girlfriend present and will be transportation home.

## 2021-09-21 NOTE — Discharge Instructions (Addendum)
Joel Kelly,  You were in the hospital with an infection of your right arm/elbow. You had surgery to evacuate the fluid that built up in your elbow area. You have been started on antibiotics to treat the infection. Please follow-up with the orthopedic surgeon on 7/21 for management of your incision wound. Please continue antibiotics.

## 2021-09-21 NOTE — Plan of Care (Signed)
  Problem: Education: Goal: Knowledge of General Education information will improve Description: Including pain rating scale, medication(s)/side effects and non-pharmacologic comfort measures Outcome: Progressing   Problem: Health Behavior/Discharge Planning: Goal: Ability to manage health-related needs will improve Outcome: Progressing   Problem: Clinical Measurements: Goal: Ability to maintain clinical measurements within normal limits will improve Outcome: Progressing Goal: Will remain free from infection Outcome: Progressing   Problem: Pain Managment: Goal: General experience of comfort will improve Outcome: Progressing   Problem: Clinical Measurements: Goal: Respiratory complications will improve Outcome: Adequate for Discharge Goal: Cardiovascular complication will be avoided Outcome: Adequate for Discharge   Problem: Activity: Goal: Risk for activity intolerance will decrease Outcome: Adequate for Discharge   Problem: Nutrition: Goal: Adequate nutrition will be maintained Outcome: Adequate for Discharge   Problem: Skin Integrity: Goal: Risk for impaired skin integrity will decrease Outcome: Adequate for Discharge

## 2021-09-24 LAB — AEROBIC/ANAEROBIC CULTURE W GRAM STAIN (SURGICAL/DEEP WOUND): Gram Stain: NONE SEEN

## 2021-12-11 ENCOUNTER — Emergency Department (HOSPITAL_COMMUNITY)
Admission: EM | Admit: 2021-12-11 | Discharge: 2021-12-11 | Disposition: A | Discharge status: Home / Self Care | Payer: BC Managed Care – PPO | Attending: Emergency Medicine | Admitting: Emergency Medicine

## 2021-12-11 ENCOUNTER — Encounter (HOSPITAL_COMMUNITY): Payer: Self-pay

## 2021-12-11 DIAGNOSIS — K5900 Constipation, unspecified: Secondary | ICD-10-CM | POA: Diagnosis not present

## 2021-12-11 DIAGNOSIS — R10819 Abdominal tenderness, unspecified site: Secondary | ICD-10-CM | POA: Diagnosis present

## 2021-12-11 MED ORDER — FLEET ENEMA 7-19 GM/118ML RE ENEM
1.0000 | ENEMA | Freq: Once | RECTAL | Status: AC
  Administered 2021-12-11: 1 via RECTAL
  Filled 2021-12-11: qty 1

## 2021-12-11 NOTE — ED Provider Notes (Signed)
[facility]-EMERGENCY DEPT Provider Note   CSN: [id number] Arrival date & time: 12/11/21  0405     History  No chief complaint on file.   Joel Kelly is a 36 y.o. male.  Patient is a 36 year old male with history of recent umbilical hernia repair performed laparoscopically by Dr. Derrell Lolling.  This was done on Wednesday.  Patient reports he has not had a bowel movement since that time.  He has been taking tramadol and oxycodone since the surgery.  He denies any vomiting.  He reports abdominal fullness and distention.  He denies fevers or chills.  The history is provided by the patient.       Home Medications Prior to Admission medications   Medication Sig Start Date End Date Taking? Authorizing Provider  anastrozole (ARIMIDEX) 1 MG tablet Take 1 mg by mouth once a week. Tuesday 08/22/21   [provider]  fluticasone (FLONASE) 50 MCG/ACT nasal spray Place 1 spray into both nostrils daily as needed for allergies. 04/27/21   [provider]  fluticasone furoate-vilanterol (BREO ELLIPTA) 200-25 MCG/ACT AEPB Inhale 1 puff into the lungs daily. Patient taking differently: Inhale 1 puff into the lungs daily as needed (shortness of breath). 02/03/21   Mannam, Colbert Coyer, MD  ibuprofen (ADVIL) 200 MG tablet Take 600 mg by mouth every 6 (six) hours as needed for mild pain.    [provider]  oxyCODONE-acetaminophen (PERCOCET/ROXICET) 5-325 MG tablet Take 1-2 tablets by mouth every 6 (six) hours as needed for moderate pain. 09/21/21   Narda Bonds, MD  spironolactone (ALDACTONE) 25 MG tablet Take 25 mg by mouth daily. 09/01/21   [provider]      Allergies    Patient has no known allergies.    Review of Systems   Review of Systems  All other systems reviewed and are negative.   Physical Exam Updated Vital Signs There were no vitals taken for this visit. Physical Exam Vitals and nursing note reviewed.  Constitutional:       General: He is not in acute distress.    Appearance: He is well-developed. He is not diaphoretic.  HENT:     Head: Normocephalic and atraumatic.  Cardiovascular:     Rate and Rhythm: Normal rate and regular rhythm.     Heart sounds: No murmur heard.    No friction rub.  Pulmonary:     Effort: Pulmonary effort is normal. No respiratory distress.     Breath sounds: Normal breath sounds. No wheezing or rales.  Abdominal:     General: Bowel sounds are normal. There is no distension.     Palpations: Abdomen is soft.     Tenderness: There is abdominal tenderness. There is no right CVA tenderness, left CVA tenderness, guarding or rebound.     Comments: There is generalized abdominal tenderness.  Musculoskeletal:        General: Normal range of motion.     Cervical back: Normal range of motion and neck supple.  Skin:    General: Skin is warm and dry.  Neurological:     Mental Status: He is alert and oriented to person, place, and time.     Coordination: Coordination normal.     ED Results / Procedures / Treatments   Labs (all labs ordered are listed, but only abnormal results are displayed) Labs Reviewed - No data to display  EKG None  Radiology No results found.  Procedures Procedures    Medications Ordered in ED  Medications  sodium phosphate (FLEET) 7-19 GM/118ML enema 1 enema (has no administration in time range)    ED Course/ Medical Decision Making/ A&P  Patient presenting here with complaints of constipation.  He underwent laparoscopic umbilical hernia repair 5 days ago with Dr. Rosendo Gros.  He has been taking Percocet, but has not had a bowel movement since leaving the hospital.  He is feeling full and uncomfortable.  Patient arrives here with stable vital signs and is afebrile.  Physical examination reveals generalized abdominal tenderness, but no rigidity or peritoneal signs.  Rectal examination reveals fecal matter within the rectal vault consistent with a fecal  impaction.  He was given a fleets enema with good results and is now feeling better.  I feel as though he can safely be discharged with outpatient follow-up and return as needed.  I highly doubt surgical complication or other issues.  I will recommend he use magnesium citrate for a full cleanout and return to the ER if symptoms worsen or change.  Final Clinical Impression(s) / ED Diagnoses Final diagnoses:  None    Rx / DC Orders ED Discharge Orders     None         Veryl Speak, MD 12/11/21 [phone]

## 2021-12-11 NOTE — Discharge Instructions (Signed)
Drink an entire 10 ounce bottle of magnesium citrate for relief of constipation.  This medication is available over-the-counter and without a prescription.  Return to the emergency department if you develop severe abdominal pain, high fever, bloody stools, or for other new and concerning symptoms.

## 2021-12-11 NOTE — ED Triage Notes (Signed)
Pt was brought into the ED for constipation s/p umbilical hernia surgery. Patient  had the surgery on weds and has been taking tramadol and oxycodone for the past few days . States he has been taking ofc laxative with no success.

## 2022-04-17 ENCOUNTER — Encounter: Payer: Self-pay | Admitting: Gastroenterology

## 2022-04-21 ENCOUNTER — Other Ambulatory Visit: Payer: Self-pay | Admitting: Pulmonary Disease

## 2022-04-30 ENCOUNTER — Telehealth: Payer: Self-pay | Admitting: Pulmonary Disease

## 2022-04-30 NOTE — Telephone Encounter (Signed)
Patient states needs refill for [facility]. Pharmacy is [facility] [address] [place]. Patient phone number is [phone].

## 2022-05-01 NOTE — Telephone Encounter (Signed)
Pt needs an appt prior to [place] being able to refilled as it has been over a year since pt has been seen. Attempted to call pt but unable to reach. Left pt a detailed message letting him know that we needed him to be seen for an appt prior to [place] being able to refill meds. Nothing further needed.

## 2022-05-04 ENCOUNTER — Ambulatory Visit: Payer: BC Managed Care – PPO | Admitting: Primary Care

## 2022-05-07 ENCOUNTER — Encounter: Payer: Self-pay | Admitting: Adult Health

## 2022-05-07 ENCOUNTER — Ambulatory Visit (INDEPENDENT_AMBULATORY_CARE_PROVIDER_SITE_OTHER): Payer: BC Managed Care – PPO

## 2022-05-07 ENCOUNTER — Ambulatory Visit: Payer: BC Managed Care – PPO | Admitting: Adult Health

## 2022-05-07 VITALS — BP 100/68 | HR 81 | Temp 98.2°F | Ht 70.0 in | Wt 216.2 lb

## 2022-05-07 DIAGNOSIS — J4540 Moderate persistent asthma, uncomplicated: Secondary | ICD-10-CM

## 2022-05-07 LAB — POCT EXHALED NITRIC OXIDE: FeNO level (ppb): 29

## 2022-05-07 MED ORDER — FLUTICASONE FUROATE-VILANTEROL 200-25 MCG/ACT IN AEPB: 1.0000 | INHALATION_SPRAY | Freq: Every day | RESPIRATORY_TRACT | 5 refills | Status: DC

## 2022-05-07 MED ORDER — PREDNISONE 10 MG PO TABS: ORAL_TABLET | ORAL | 0 refills | Status: DC

## 2022-05-07 MED ORDER — ALBUTEROL SULFATE (2.5 MG/3ML) 0.083% IN NEBU
2.5000 mg | INHALATION_SOLUTION | Freq: Once | RESPIRATORY_TRACT | Status: AC
  Administered 2022-05-07: 2.5 mg via RESPIRATORY_TRACT

## 2022-05-07 MED ORDER — ALBUTEROL SULFATE HFA 108 (90 BASE) MCG/ACT IN AERS: 1.0000 | INHALATION_SPRAY | Freq: Four times a day (QID) | RESPIRATORY_TRACT | 2 refills | Status: DC | PRN

## 2022-05-07 NOTE — Progress Notes (Signed)
$'@Patient'p$  ID: Joel Kelly, male    DOB: [birth year], 37 y.o.   MRN: [id number]  Chief Complaint  Patient presents with   Follow-up    Referring provider: No ref. 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  No feather pillows or comforter Smoking history: Was heavy intermittent user of cocaine, tobacco and EtOH.  He quit all of this in September 2022 Travel history: No significant travel history Relevant family history: Grandparents died of COPD.  They were heavy smokers  PFTs: 03/07/2021 FVC 4.60 [85%], FEV1 3.24 [25%], F/F 71, TLC 6.67 [96%], DLCO 31.77 [100%] Mild obstructive airway disease  Labs: CBC 02/03/2021-WBC 7.9, eos 2.4%, absolute eosinophil count 190 IgE 02/03/2021-304  05/07/2022 Follow up: Asthma and allergic rhinitis Patient presents for a 1 year follow-up.  Patient has underlying moderate persistent asthma and allergic rhinitis.  Patient says he has felt that his breathing was not doing as well.  Worse for last 2-3 weeks with cough-minimally productive and  No fever. Had more asthma symptoms with cough and wheezing.  He is only been using his Breo as needed.  Exhaled nitric oxide testing today is slightly elevated at 29ppb .  Has nasal drainage intermittently.  Definitely worse during seasonal changes in the spring and fall.  Patient does complain of severe reflux that he is taking over-the-counter Tums for.  He has reflux up into his throat at time.  Patient says he does vape intermittently.  Has not vaped in the last 2 months.  Patient denies any hemoptysis, chest pain, fever, discolored mucus.  Patient says he has been using his son's albuterol inhaler at home.  He has run out of his Breo inhaler.    No Known Allergies  Immunization History  Administered  Date(s) Administered   Influenza-Unspecified 01/04/2021    Past Medical History:  Diagnosis Date   Asthma    GERD (gastroesophageal reflux disease)    TUMS as needed   Lateral meniscal tear 10/2015   right knee   Loose body of right knee 10/2015    Tobacco History: Social History   Tobacco Use  Smoking Status Former   Types: Cigarettes  Smokeless Tobacco Current   Types: Chew   Ready to quit: Not Answered Counseling given: Not Answered   Outpatient Medications Prior to Visit  Medication Sig Dispense Refill   anastrozole (ARIMIDEX) 1 MG tablet Take 1 mg by mouth once a week. Tuesday     ibuprofen (ADVIL) 200 MG tablet Take 600 mg by mouth every 6 (six) hours as needed for mild pain.     fluticasone furoate-vilanterol (BREO ELLIPTA) 200-25 MCG/ACT AEPB Inhale 1 puff into the lungs daily. (Patient taking differently: Inhale 1 puff into the lungs daily as needed (shortness of breath).) 60 each 2   fluticasone (FLONASE) 50 MCG/ACT nasal spray Place 1 spray into both nostrils daily as needed for allergies. (Patient not taking: Reported on 05/07/2022)     oxyCODONE-acetaminophen (PERCOCET/ROXICET) 5-325 MG tablet Take 1-2 tablets by mouth every 6 (six) hours as needed for moderate pain. (Patient not taking: Reported on 05/07/2022) 20 tablet 0   spironolactone (ALDACTONE) 25 MG tablet Take 25 mg by mouth daily. (Patient not taking: Reported on 05/07/2022)     No facility-administered medications prior to visit.     Review of  Systems:   Constitutional:   No  weight loss, night sweats,  Fevers, chills, fatigue, or  lassitude.  HEENT:   No headaches,  Difficulty swallowing,  Tooth/dental problems, or  Sore throat,                No sneezing, itching, ear ache, + nasal congestion, post nasal drip,   CV:  No chest pain,  Orthopnea, PND, swelling in lower extremities, anasarca, dizziness, palpitations, syncope.   GI  No , nausea, vomiting, diarrhea, change in bowel habits, loss of  appetite, bloody stools.   Resp:.  No chest wall deformity  Skin: no rash or lesions.  GU: no dysuria, change in color of urine, no urgency or frequency.  No flank pain, no hematuria   MS:  No joint pain or swelling.  No decreased range of motion.  No back pain.    Physical Exam  BP 100/68 (BP Location: Left Arm, Patient Position: Sitting, Cuff Size: Large)   Pulse 81   Temp 98.2 F (36.8 C) (Oral)   Ht '5\' 10"'$  (1.778 m)   Wt 216 lb 3.2 oz (98.1 kg)   SpO2 97%   BMI 31.02 kg/m   GEN: A/Ox3; pleasant , NAD, well nourished    HEENT:  [place]/AT,  EACs-clear, TMs-wnl, NOSE-clear, THROAT-clear, no lesions, no postnasal drip or exudate noted.  No stridor  NECK:  Supple w/ fair ROM; no JVD; normal carotid impulses w/o bruits; no thyromegaly or nodules palpated; no lymphadenopathy.    RESP few expiratory wheezes bilaterally.  Speaks in full sentences.  no accessory muscle use, no dullness to percussion  CARD:  RRR, no m/r/g, no peripheral edema, pulses intact, no cyanosis or clubbing.  GI:   Soft & nt; nml bowel sounds; no organomegaly or masses detected.   Musco: Warm bil, no deformities or joint swelling noted.   Neuro: alert, no focal deficits noted.    Skin: Warm, no lesions or rashes    Lab Results:   BMET   BNP No results found for: "BNP"  ProBNP No results found for: "PROBNP"  Imaging: No results found.  albuterol (PROVENTIL) (2.5 MG/3ML) 0.083% nebulizer solution 2.5 mg     Date Action Dose Route User   05/07/2022 1506 Given 2.5 mg Nebulization Nolon Stalls, Kimber Relic, RN          Latest Ref Rng & Units 03/07/2021   12:53 PM  PFT Results  FVC-Predicted Pre % 82   FVC-Post L 4.60   FVC-Predicted Post % 85   Pre FEV1/FVC % % 70   Post FEV1/FCV % % 71   FEV1-Pre L 3.09   FEV1-Predicted Pre % 71   FEV1-Post L 3.24   DLCO uncorrected ml/min/mmHg 31.77   DLCO UNC% % 100   DLCO corrected ml/min/mmHg 31.95   DLCO COR %Predicted % 100   DLVA Predicted  % 113   TLC L 6.67   TLC % Predicted % 96   RV % Predicted % 135     No results found for: "NITRICOXIDE"      Assessment & Plan:   Moderate persistent asthma Acute asthmatic exacerbation.  Patient is encouraged on maintenance regimen.  Will begin Breo 1 puff daily.  Albuterol inhaler for rescue use.  Treat asthma triggers -add in Zyrtec for postnasal drainage control and Prilosec and Pepcid for GERD control. Asthma action plan discussed.  Albuterol nebulizer treatment given in office.  Check chest x-ray today.  Prednisone taper over the  next week.  Plan  Patient Instructions  Chest xray today  Prednisone taper over next week  Restart BREO 1 puff daily , rinse after use .  Albuterol inhaler As needed   Begin Zyrtec '10mg'$  At bedtime   Begin Prilosec '20mg'$  daily before meal .  Begin Pepcid  '20mg'$ At bedtime    Follow up in 6 weeks with Dr. Vaughan Browner or Nissim Fleischer NP and As needed   Please contact office for sooner follow up if symptoms do not improve or worsen or seek emergency care    '     Creed Kail, NP 05/07/2022

## 2022-05-07 NOTE — Patient Instructions (Addendum)
Chest xray today  Prednisone taper over next week  Restart BREO 1 puff daily , rinse after use .  Albuterol inhaler As needed   Begin Zyrtec '10mg'$  At bedtime   Begin Prilosec '20mg'$  daily before meal .  Begin Pepcid  '20mg'$ At bedtime    Follow up in 6 weeks with Dr. Vaughan Browner or Jenniferann Stuckert NP and As needed   Please contact office for sooner follow up if symptoms do not improve or worsen or seek emergency care

## 2022-05-07 NOTE — Assessment & Plan Note (Signed)
Acute asthmatic exacerbation.  Patient is encouraged on maintenance regimen.  Will begin Breo 1 puff daily.  Albuterol inhaler for rescue use.  Treat asthma triggers -add in Zyrtec for postnasal drainage control and Prilosec and Pepcid for GERD control. Asthma action plan discussed.  Albuterol nebulizer treatment given in office.  Check chest x-ray today.  Prednisone taper over the next week.  Plan  Patient Instructions  Chest xray today  Prednisone taper over next week  Restart BREO 1 puff daily , rinse after use .  Albuterol inhaler As needed   Begin Zyrtec '10mg'$  At bedtime   Begin Prilosec '20mg'$  daily before meal .  Begin Pepcid  '20mg'$ At bedtime    Follow up in 6 weeks with Dr. Vaughan Browner or Nera Haworth NP and As needed   Please contact office for sooner follow up if symptoms do not improve or worsen or seek emergency care    '

## 2022-05-24 ENCOUNTER — Encounter: Payer: Self-pay | Admitting: Gastroenterology

## 2022-05-24 ENCOUNTER — Ambulatory Visit: Payer: BC Managed Care – PPO | Admitting: Gastroenterology

## 2022-05-24 VITALS — BP 122/78 | HR 78 | Ht 70.0 in | Wt 215.0 lb

## 2022-05-24 DIAGNOSIS — K219 Gastro-esophageal reflux disease without esophagitis: Secondary | ICD-10-CM | POA: Diagnosis not present

## 2022-05-24 DIAGNOSIS — R1319 Other dysphagia: Secondary | ICD-10-CM | POA: Diagnosis not present

## 2022-05-24 NOTE — Progress Notes (Signed)
HPI : Joel Kelly is a very pleasant 37 year old male with a history of asthma who presents to our office for further evaluation and management of chronic GERD symptoms.  He has had reflux symptoms for many years, but more recently his symptoms have been increasing in frequency.  For the past few months he has been having symptoms most everyday His symptoms consist of a burning pain in his chest and throat, excessive belching, fullness and pressure in his chest and regurgitation of stomach contents.   He can have these symptoms anytime, but they are more frequently noted in the evenings.  His awakened from sleep a few times a week with these symptoms. He has occasional dysphagia to solid foods sometimes requiring a beverage to wash it down.  No episodes of having to forcefully vomit stuck food.  No liquid dysphagia.  No nausea/vomiting.  No upper abdominal pain His symptoms are usually worsened when he eats fried/breaded foods or red sauces.   He has previous taken TUMS and Pepto, but was started on omeprazole qam and Pepcid qhs a few weeks ago by his asthma provider.  His symptoms are little better since starting these meds.  No chronic lower Gi symptoms (constipation, diarrhea, abdominal pain, hematochezia).  Past Medical History:  Diagnosis Date   Asthma    GERD (gastroesophageal reflux disease)    TUMS as needed   Lateral meniscal tear 10/2015   right knee   Loose body of right knee 10/2015     Past Surgical History:  Procedure Laterality Date   HERNIA REPAIR  2023   IRRIGATION AND DEBRIDEMENT ELBOW Right 09/19/2021   Procedure: IRRIGATION AND DEBRIDEMENT RIGHT ELBOW;  Surgeon: Charlotte Crumb, MD;  Location: [facility];  Service: Orthopedics;  Laterality: Right;   KNEE ARTHROSCOPY W/ ACL RECONSTRUCTION Right 10/14/2009   KNEE ARTHROSCOPY W/ ACL RECONSTRUCTION Left    KNEE ARTHROSCOPY W/ ACL RECONSTRUCTION Bilateral    2018   KNEE ARTHROSCOPY WITH LATERAL MENISECTOMY Right  11/02/2015   Procedure: Right KNEE ARTHROSCOPY WITH LATERAL MENISCECTOMY and removal of foreign body, microfracture, chondroplasty;  Surgeon: Elsie Saas, MD;  Location: [facility];  Service: Orthopedics;  Laterality: Right;  Right KNEE ARTHROSCOPY WITH LATERAL MENISCECTOMY and removal of foreign body, microfracture, chondroplasty   KNEE ARTHROSCOPY WITH MENISCAL REPAIR Right    Family History  Problem Relation Age of Onset   Renal cancer Father    Liver disease Neg Hx    Colon cancer Neg Hx    Esophageal cancer Neg Hx    Social History   Tobacco Use   Smoking status: Former    Types: Cigarettes   Smokeless tobacco: Current    Types: Chew  Vaping Use   Vaping Use: Some days  Substance Use Topics   Alcohol use: Yes    Comment: occasionally   Drug use: No   Current Outpatient Medications  Medication Sig Dispense Refill   albuterol (VENTOLIN HFA) 108 (90 Base) MCG/ACT inhaler Inhale 1-2 puffs into the lungs every 6 (six) hours as needed. 8 g 2   anastrozole (ARIMIDEX) 1 MG tablet Take 1 mg by mouth once a week. Tuesday     fluticasone furoate-vilanterol (BREO ELLIPTA) 200-25 MCG/ACT AEPB Inhale 1 puff into the lungs daily. 1 each 5   ibuprofen (ADVIL) 200 MG tablet Take 600 mg by mouth every 6 (six) hours as needed for mild pain.     No current facility-administered medications for this visit.  No Known Allergies   Review of Systems: All systems reviewed and negative except where noted in HPI.    DG Chest 2 View  Result Date: 05/07/2022 CLINICAL DATA:  asthma flare EXAM: CHEST - 2 VIEW COMPARISON:  02/03/2021 FINDINGS: The heart size and mediastinal contours are within normal limits. Both lungs are clear. The visualized skeletal structures are unremarkable. IMPRESSION: No active cardiopulmonary disease. Electronically Signed   By: Jerilynn Mages.  Shick M.D.   On: 05/07/2022 15:58    Physical Exam: Ht 5\' 10"  (1.778 m)   Wt 215 lb (97.5 kg)   BMI 30.85 kg/m   Constitutional: Pleasant,well-developed, Caucasian male in no acute distress. HEENT: Normocephalic and atraumatic. Conjunctivae are normal. No scleral icterus. Neck supple.  Cardiovascular: Normal rate, regular rhythm.  Pulmonary/chest: Effort normal and breath sounds normal. No wheezing, rales or rhonchi. Abdominal: Soft, nondistended, nontender. Bowel sounds active throughout. There are no masses palpable. No hepatomegaly. Extremities: no edema Neurological: Alert and oriented to person place and time. Skin: Skin is warm and dry. No rashes noted. Psychiatric: Normal mood and affect. Behavior is normal.  CBC    Component Value Date/Time   WBC 10.2 09/21/2021 0824   RBC 4.37 09/21/2021 0824   HGB 13.4 09/21/2021 0824   HCT 39.9 09/21/2021 0824   PLT 236 09/21/2021 0824   MCV 91.3 09/21/2021 0824   MCH 30.7 09/21/2021 0824   MCHC 33.6 09/21/2021 0824   RDW 12.5 09/21/2021 0824   LYMPHSABS 1.2 09/20/2021 0129   MONOABS 0.4 09/20/2021 0129   EOSABS 0.0 09/20/2021 0129   BASOSABS 0.0 09/20/2021 0129    CMP     Component Value Date/Time   NA 136 09/20/2021 0129   K 4.2 09/20/2021 0129   CL 104 09/20/2021 0129   CO2 23 09/20/2021 0129   GLUCOSE 133 (H) 09/20/2021 0129   BUN 15 09/20/2021 0129   CREATININE 1.18 09/20/2021 0129   CALCIUM 9.2 09/20/2021 0129   PROT 7.5 10/14/2009 1135   ALBUMIN 4.6 10/14/2009 1135   AST 36 10/14/2009 1135   ALT 27 10/14/2009 1135   ALKPHOS 68 10/14/2009 1135   BILITOT 0.8 10/14/2009 1135   GFRNONAA >60 09/20/2021 0129   GFRAA >60 01/08/2019 1323     ASSESSMENT AND PLAN: 37 year old male with chronic typical GERD symptoms of heartburn and regurgitation, partially improved with daily PPI and qhs H2RA with occasional dysphagia.  Suspect Schatzki ring or low grade peptic stricture.  We discussed the pathophysiology of GERD and the principles of GERD management to include lifestyle modifications  such as dietary discretion (avoidance of  alcohol, tobacco, caffeinated and carbonated beverages, spicy/greasy foods, citrus, peppermint/chocolate), weight loss if applicable, head of bed elevation andconsuming last meal of day within 3 hours of bedtime; pharmacologic options to include PPIs, H2RAs and OTC antacids; and finally surgical or endoscopic fundoplication. Will plan for EGD with possible dilation of stricture/ring and will assess for esophagitis, hiatal hernia/barrett's and also exclude EoE  GERD/dysphagia - EGD with esophageal biopsies, possible dilation - GERD handout - Continue daily omeprazole/qhs Pepcid for now; will potentially change meds based on endoscopy findings  The details, risks (including bleeding, perforation, infection, missed lesions, medication reactions and possible hospitalization or surgery if complications occur), benefits, and alternatives to EGD with possible biopsy and possible dilation were discussed with the patient and he consents to proceed.   Braheem Tomasik E. Candis Schatz, MD [facility]  No ref. provider found

## 2022-05-24 NOTE — Patient Instructions (Signed)
You have been scheduled for an endoscopy. Please follow written instructions given to you at your visit today. If you use inhalers (even only as needed), please bring them with you on the day of your procedure.  _______________________________________________________  If your blood pressure at your visit was 140/90 or greater, please contact your primary care physician to follow up on this.  _______________________________________________________  If you are age 72 or older, your body mass index should be between 23-30. Your Body mass index is 30.85 kg/m. If this is out of the aforementioned range listed, please consider follow up with your Primary Care Provider.  If you are age 52 or younger, your body mass index should be between 19-25. Your Body mass index is 30.85 kg/m. If this is out of the aformentioned range listed, please consider follow up with your Primary Care Provider.   ________________________________________________________  The [facility] providers would like to encourage you to use [facility] to communicate with providers for non-urgent requests or questions.  Due to long hold times on the telephone, sending your provider a message by [facility] may be a faster and more efficient way to get a response.  Please allow 48 business hours for a response.  Please remember that this is for non-urgent requests.  _______________________________________________________

## 2022-06-06 ENCOUNTER — Ambulatory Visit (AMBULATORY_SURGERY_CENTER): Payer: BC Managed Care – PPO | Admitting: Gastroenterology

## 2022-06-06 ENCOUNTER — Encounter: Payer: Self-pay | Admitting: Gastroenterology

## 2022-06-06 VITALS — BP 101/51 | HR 62 | Temp 98.0°F | Resp 11 | Ht 70.0 in | Wt 215.0 lb

## 2022-06-06 DIAGNOSIS — K222 Esophageal obstruction: Secondary | ICD-10-CM | POA: Diagnosis present

## 2022-06-06 DIAGNOSIS — K259 Gastric ulcer, unspecified as acute or chronic, without hemorrhage or perforation: Secondary | ICD-10-CM

## 2022-06-06 DIAGNOSIS — K2100 Gastro-esophageal reflux disease with esophagitis, without bleeding: Secondary | ICD-10-CM

## 2022-06-06 DIAGNOSIS — K319 Disease of stomach and duodenum, unspecified: Secondary | ICD-10-CM

## 2022-06-06 DIAGNOSIS — K2289 Other specified disease of esophagus: Secondary | ICD-10-CM

## 2022-06-06 DIAGNOSIS — R1319 Other dysphagia: Secondary | ICD-10-CM

## 2022-06-06 MED ORDER — OMEPRAZOLE 40 MG PO CPDR
40.0000 mg | DELAYED_RELEASE_CAPSULE | Freq: Two times a day (BID) | ORAL | 0 refills | Status: DC
Start: 2022-06-06 — End: 2022-06-21

## 2022-06-06 MED ORDER — SODIUM CHLORIDE 0.9 % IV SOLN
500.0000 mL | Freq: Once | INTRAVENOUS | Status: DC
Start: 2022-06-06 — End: 2022-06-06

## 2022-06-06 NOTE — Progress Notes (Signed)
Vss nad trans to pacu 

## 2022-06-06 NOTE — Progress Notes (Signed)
History and Physical Interval Note:  06/06/2022 9:35 AM  Joel Kelly  has presented today for endoscopic procedure(s), with the diagnosis of  Encounter Diagnosis  Name Primary?   Esophageal dysphagia Yes  .  The various methods of evaluation and treatment have been discussed with the patient and/or family. After consideration of risks, benefits and other options for treatment, the patient has consented to  the endoscopic procedure(s).   The patient's history has been reviewed, patient examined, no change in status, stable for endoscopic procedure(s).  I have reviewed the patient's chart and labs.  Questions were answered to the patient's satisfaction.     Devyne Hauger E. Candis Schatz, MD [facility]

## 2022-06-06 NOTE — Op Note (Signed)
Progress Patient Name: Joel Kelly Procedure Date: 06/06/2022 9:36 AM MRN: [id number] Endoscopist: Nicki Reaper E. Candis Schatz , MD, [id number] Age: 37 Referring MD:  Date of Birth: 01-27-1986 Gender: Male Account #: [id number] Procedure:                Upper GI endoscopy Indications:              Dysphagia, Esophageal reflux symptoms that persist                            despite appropriate therapy Medicines:                Monitored Anesthesia Care Procedure:                Pre-Anesthesia Assessment:                           - Prior to the procedure, a History and Physical                            was performed, and patient medications and                            allergies were reviewed. The patient's tolerance of                            previous anesthesia was also reviewed. The risks                            and benefits of the procedure and the sedation                            options and risks were discussed with the patient.                            All questions were answered, and informed consent                            was obtained. Prior Anticoagulants: The patient has                            taken no anticoagulant or antiplatelet agents. ASA                            Grade Assessment: II - A patient with mild systemic                            disease. After reviewing the risks and benefits,                            the patient was deemed in satisfactory condition to                            undergo the procedure.  After obtaining informed consent, the endoscope was                            passed under direct vision. Throughout the                            procedure, the patient's blood pressure, pulse, and                            oxygen saturations were monitored continuously. The                            GIF [id number] was introduced through the                            mouth, and advanced to the  second part of duodenum.                            The upper GI endoscopy was accomplished without                            difficulty. The patient tolerated the procedure                            well. Scope In: Scope Out: Findings:                 The examined portions of the nasopharynx,                            oropharynx and larynx were normal.                           LA Grade B (one or more mucosal breaks greater than                            5 mm, not extending between the tops of two mucosal                            folds) esophagitis with no bleeding was found.                           A widely patent Schatzki ring was found at the                            gastroesophageal junction. A TTS dilator was passed                            through the scope. Dilation with an 18-19-20 mm                            balloon dilator was performed to 20 mm. The  dilation site was examined and showed no change.                            Estimated blood loss: none.                           The exam of the esophagus was otherwise normal.                           Biopsies were obtained from the proximal and distal                            esophagus with cold forceps for histology of                            suspected eosinophilic esophagitis. Estimated blood                            loss was minimal.                           The gastroesophageal flap valve was visualized                            endoscopically and classified as Hill Grade IV (no                            fold, wide open lumen, hiatal hernia present).                           A 4 cm hiatal hernia was present.                           Two non-bleeding cratered gastric ulcers with no                            stigmata of bleeding were found in the gastric                            antrum. The largest lesion was 4 mm in largest                            dimension.  Biopsies were taken with a cold forceps                            for Helicobacter pylori testing. Estimated blood                            loss was minimal. Estimated blood loss was minimal.                           The exam of the stomach was otherwise normal.  Biopsies were taken with a cold forceps in the                            gastric antrum for Helicobacter pylori testing.                            Estimated blood loss was minimal.                           The examined duodenum was normal. Complications:            No immediate complications. Estimated Blood Loss:     Estimated blood loss was minimal. Impression:               - The examined portions of the nasopharynx,                            oropharynx and larynx were normal.                           - LA Grade B reflux esophagitis with no bleeding.                           - Widely patent Schatzki ring. Dilated.                           - Gastroesophageal flap valve classified as Hill                            Grade IV (no fold, wide open lumen, hiatal hernia                            present).                           - 4 cm hiatal hernia.                           - Non-bleeding gastric ulcers with no stigmata of                            bleeding. Biopsied.                           - Normal examined duodenum.                           - Biopsies were taken with a cold forceps for                            evaluation of eosinophilic esophagitis.                           - Biopsies were taken with a cold forceps for  Helicobacter pylori testing. Recommendation:           - Patient has a contact number available for                            emergencies. The signs and symptoms of potential                            delayed complications were discussed with the                            patient. Return to normal activities tomorrow.                             Written discharge instructions were provided to the                            patient.                           - Resume previous diet.                           - Use Prilosec (omeprazole) 40 mg PO BID for 8                            weeks.                           - Repeat upper endoscopy in 8 weeks to check                            healing of ulcer and esophagitis.                           - Await pathology results.                           - Avoid ibuprofen, naproxen, or other non-steroidal                            anti-inflammatory drugs. Angellynn Kimberlin E. Candis Schatz, MD 06/06/2022 10:03:51 AM This report has been signed electronically.

## 2022-06-06 NOTE — Progress Notes (Signed)
Patient woke up trying to take off his oxygen.  Got very agitated when I and his mother tried to put it back on.  Sats 87 without 2 liters of oxygen. Pt took it off and it was left off o2 sats 92.  Pt remains agitated during discharge.  Mother present.  Mother agitated as well when pt's history was discussed.

## 2022-06-06 NOTE — Progress Notes (Signed)
Patient O2 sat was 89-91%, patient states he has not had to use his inhaler for a couple of days. He did not bring inhaler with him, states he doesn't check O2 at home, doesn't feel SOB, lung sounds are clear thru out, O2 level finally reached 92% with deep pursed lip breathing.

## 2022-06-06 NOTE — Patient Instructions (Signed)
Take your new medication as ordered.  Take it on an empty stomach or else it won't work.  Resume all of your previous medications.  Make an appointment with your Pulmonologist.  YOU HAD AN ENDOSCOPIC PROCEDURE TODAY AT [facility]:   Refer to the procedure report that was given to you for any specific questions about what was found during the examination.  If the procedure report does not answer your questions, please call your gastroenterologist to clarify.  If you requested that your care partner not be given the details of your procedure findings, then the procedure report has been included in a sealed envelope for you to review at your convenience later.  YOU SHOULD EXPECT: Some feelings of bloating in the abdomen. Passage of more gas than usual.  Walking can help get rid of the air that was put into your GI tract during the procedure and reduce the bloating.  Please Note:  You might notice some irritation and congestion in your nose or some drainage.  This is from the oxygen used during your procedure.  There is no need for concern and it should clear up in a day or so.  SYMPTOMS TO REPORT IMMEDIATELY:  Following upper endoscopy (EGD)  Vomiting of blood or coffee ground material  New chest pain or pain under the shoulder blades  Painful or persistently difficult swallowing  New shortness of breath  Fever of 100F or higher  Black, tarry-looking stools  For urgent or emergent issues, a gastroenterologist can be reached at any hour by calling [phone]. Do not use MyChart messaging for urgent concerns.    DIET:  We do recommend clear liquids 11:30am-12:30pm.  Soft diet is suggested for the rest of today. Drink plenty of fluids but you should avoid alcoholic beverages.  ACTIVITY:  You should plan to take it easy for the rest of today and you should NOT DRIVE or use heavy machinery until tomorrow (because of the sedation medicines used during the test).    FOLLOW  UP: Our staff will call the number listed on your records the next business day following your procedure.  We will call around 7:15- 8:00 am to check on you and address any questions or concerns that you may have regarding the information given to you following your procedure. If we do not reach you, we will leave a message.     If any biopsies were taken you will be contacted by phone or by letter within the next 1-3 weeks.  Please call us at [phone] if you have not heard about the biopsies in 3 weeks.    SIGNATURES/CONFIDENTIALITY: You and/or your care partner have signed paperwork which will be entered into your electronic medical record.  These signatures attest to the fact that that the information above on your After Visit Summary has been reviewed and is understood.  Full responsibility of the confidentiality of this discharge information lies with you and/or your care-partner.

## 2022-06-06 NOTE — Progress Notes (Signed)
Called to room to assist during endoscopic procedure.  Patient ID and intended procedure confirmed with present staff. Received instructions for my participation in the procedure from the performing physician.  

## 2022-06-07 ENCOUNTER — Telehealth: Payer: Self-pay | Admitting: *Deleted

## 2022-06-07 NOTE — Telephone Encounter (Signed)
Follow up call attempt.  LVM to call if any questions or concerns. 

## 2022-06-14 NOTE — Progress Notes (Signed)
[place],  The biopsies taken from your stomach were notable for mild reactive gastropathy which is a common finding and often related to use of certain medications (usually NSAIDs), but there was no evidence of Helicobacter pylori infection.  The biopsies of your esophagus did not show any evidence of eosinophilic esophagitis.  There were reactive changes that were consistent with acid reflux. Please take the omeprazole twice daily and avoid all NSAIDs.  Repeat EGD as discussed to ensure healing of the ulcer and esophagitis.

## 2022-06-21 ENCOUNTER — Other Ambulatory Visit: Payer: Self-pay

## 2022-06-21 DIAGNOSIS — K2100 Gastro-esophageal reflux disease with esophagitis, without bleeding: Secondary | ICD-10-CM

## 2022-06-21 DIAGNOSIS — R1319 Other dysphagia: Secondary | ICD-10-CM

## 2022-06-21 DIAGNOSIS — K259 Gastric ulcer, unspecified as acute or chronic, without hemorrhage or perforation: Secondary | ICD-10-CM

## 2022-06-21 MED ORDER — OMEPRAZOLE 40 MG PO CPDR
40.0000 mg | DELAYED_RELEASE_CAPSULE | Freq: Two times a day (BID) | ORAL | 0 refills | Status: DC
Start: 2022-06-21 — End: 2022-11-08

## 2022-11-08 ENCOUNTER — Other Ambulatory Visit: Payer: Self-pay | Admitting: Gastroenterology

## 2022-11-08 DIAGNOSIS — R1319 Other dysphagia: Secondary | ICD-10-CM

## 2022-11-08 DIAGNOSIS — K259 Gastric ulcer, unspecified as acute or chronic, without hemorrhage or perforation: Secondary | ICD-10-CM

## 2022-11-08 DIAGNOSIS — K2100 Gastro-esophageal reflux disease with esophagitis, without bleeding: Secondary | ICD-10-CM

## 2023-01-10 ENCOUNTER — Encounter: Payer: Self-pay | Admitting: Gastroenterology

## 2023-01-28 ENCOUNTER — Ambulatory Visit
Admission: EM | Admit: 2023-01-28 | Discharge: 2023-01-28 | Disposition: A | Discharge status: Home / Self Care | Payer: BC Managed Care – PPO | Attending: Internal Medicine | Admitting: Internal Medicine

## 2023-01-28 ENCOUNTER — Encounter: Payer: Self-pay | Admitting: Emergency Medicine

## 2023-01-28 DIAGNOSIS — J069 Acute upper respiratory infection, unspecified: Secondary | ICD-10-CM | POA: Diagnosis not present

## 2023-01-28 DIAGNOSIS — Z87891 Personal history of nicotine dependence: Secondary | ICD-10-CM | POA: Diagnosis not present

## 2023-01-28 LAB — POC COVID19/FLU A&B COMBO
Covid Antigen, POC: NEGATIVE
Influenza A Antigen, POC: NEGATIVE
Influenza B Antigen, POC: NEGATIVE

## 2023-01-28 MED ORDER — ALBUTEROL SULFATE HFA 108 (90 BASE) MCG/ACT IN AERS: 1.0000 | INHALATION_SPRAY | Freq: Four times a day (QID) | RESPIRATORY_TRACT | 0 refills | Status: DC | PRN

## 2023-01-28 MED ORDER — BENZONATATE 100 MG PO CAPS: 100.0000 mg | ORAL_CAPSULE | Freq: Three times a day (TID) | ORAL | 0 refills | Status: DC

## 2023-01-28 MED ORDER — PROMETHAZINE-DM 6.25-15 MG/5ML PO SYRP: 5.0000 mL | ORAL_SOLUTION | Freq: Every evening | ORAL | 0 refills | Status: DC | PRN

## 2023-01-28 MED ORDER — IBUPROFEN 800 MG PO TABS
800.0000 mg | ORAL_TABLET | Freq: Once | ORAL | Status: AC
  Administered 2023-01-28: 800 mg via ORAL

## 2023-01-28 NOTE — ED Provider Notes (Signed)
[facility]    CSN: [id number] Arrival date & time: 01/28/23  1226      History   Chief Complaint Chief Complaint  Patient presents with   Cough    HPI Joel Kelly is a 37 y.o. male.   Patient presents to urgent care for evaluation of cough, itchy/sore throat, generalized headaches, and generalized fatigue that started approximately 4 to 5 days ago. Cough is productive with green/yellow sputum.  Denies nasal congestion, pain with swallowing, fevers, chills, body aches, nausea, vomiting, diarrhea, abdominal pain, and rash.  His son is sick with similar symptoms.  History of asthma that is usually well-controlled with as needed use of rescue inhaler.  He has not needed to use his rescue inhaler during this illness.  Former cigarette smoker, intermittently chews tobacco, otherwise no drug use.  Denies shortness of breath, chest pain, and heart palpitations.  He has attempted use of Zyrtec over-the-counter without much relief of symptoms.   Cough   Past Medical History:  Diagnosis Date   Asthma    GERD (gastroesophageal reflux disease)    TUMS as needed   Lateral meniscal tear 10/2015   right knee   Loose body of right knee 10/2015    Patient Active Problem List   Diagnosis Date Noted   Cellulitis of right upper extremity 09/20/2021   Leukocytosis 09/20/2021   Status post surgery 09/19/2021   Infected olecranon bursa, right 09/19/2021   Moderate persistent asthma 04/14/2021   Acute lateral meniscus tear of right knee    Status post anterior cruciate ligament surgery    Loose body of right knee     Past Surgical History:  Procedure Laterality Date   HERNIA REPAIR  2023   IRRIGATION AND DEBRIDEMENT ELBOW Right 09/19/2021   Procedure: IRRIGATION AND DEBRIDEMENT RIGHT ELBOW;  Surgeon: Dairl Ponder, MD;  Location: [facility] OR;  Service: Orthopedics;  Laterality: Right;   KNEE ARTHROSCOPY W/ ACL RECONSTRUCTION Right 10/14/2009   KNEE ARTHROSCOPY W/ ACL  RECONSTRUCTION Left    KNEE ARTHROSCOPY W/ ACL RECONSTRUCTION Bilateral    2018   KNEE ARTHROSCOPY WITH LATERAL MENISECTOMY Right 11/02/2015   Procedure: Right KNEE ARTHROSCOPY WITH LATERAL MENISCECTOMY and removal of foreign body, microfracture, chondroplasty;  Surgeon: Salvatore Marvel, MD;  Location: [facility];  Service: Orthopedics;  Laterality: Right;  Right KNEE ARTHROSCOPY WITH LATERAL MENISCECTOMY and removal of foreign body, microfracture, chondroplasty   KNEE ARTHROSCOPY WITH MENISCAL REPAIR Right        Home Medications    Prior to Admission medications   Medication Sig Start Date End Date Taking? Authorizing Provider  benzonatate (TESSALON) 100 MG capsule Take 1 capsule (100 mg total) by mouth every 8 (eight) hours. 01/28/23  Yes Carlisle Beers, FNP  promethazine-dextromethorphan (PROMETHAZINE-DM) 6.25-15 MG/5ML syrup Take 5 mLs by mouth at bedtime as needed for cough. 01/28/23  Yes Carlisle Beers, FNP  albuterol (VENTOLIN HFA) 108 (90 Base) MCG/ACT inhaler Inhale 1-2 puffs into the lungs every 6 (six) hours as needed. 01/28/23   Carlisle Beers, FNP  anastrozole (ARIMIDEX) 1 MG tablet Take 1 mg by mouth once a week. Tuesday 08/22/21   [provider]  doxycycline (VIBRAMYCIN) 100 MG capsule Take 100 mg by mouth 2 (two) times daily. 05/09/22   [provider]  fluticasone furoate-vilanterol (BREO ELLIPTA) 200-25 MCG/ACT AEPB Inhale 1 puff into the lungs daily. 05/07/22   Parrett, Virgel Bouquet, NP  ibuprofen (ADVIL) 200 MG tablet Take 600 mg by  mouth every 6 (six) hours as needed for mild pain.    [provider]  omeprazole (PRILOSEC) 40 MG capsule TAKE 1 CAPSULE BY MOUTH 2 TIMES A DAY BEFORE A MEAL FOR 8 WEEKS 11/08/22   Jenel Lucks, MD    Family History Family History  Problem Relation Age of Onset   Renal cancer Father    Liver disease Neg Hx    Colon cancer Neg Hx    Esophageal cancer Neg Hx     Social  History Social History   Tobacco Use   Smoking status: Former    Types: Cigarettes   Smokeless tobacco: Current    Types: Chew  Vaping Use   Vaping status: Some Days   Substances: Flavoring, Mixture of cannabinoids  Substance Use Topics   Alcohol use: Yes    Comment: occasionally   Drug use: No     Allergies   Patient has no known allergies.   Review of Systems Review of Systems  Respiratory:  Positive for cough.   Per HPI   Physical Exam Triage Vital Signs ED Triage Vitals  Encounter Vitals Group     BP 01/28/23 1247 125/88     Systolic BP Percentile --      Diastolic BP Percentile --      Pulse Rate 01/28/23 1247 67     Resp --      Temp 01/28/23 1247 (!) 97.3 F (36.3 C)     Temp Source 01/28/23 1247 Oral     SpO2 01/28/23 1247 95 %     Weight --      Height --      Head Circumference --      Peak Flow --      Pain Score 01/28/23 1248 0     Pain Loc --      Pain Education --      Exclude from Growth Chart --    No data found.  Updated Vital Signs BP 125/88 (BP Location: Left Arm)   Pulse 67   Temp (!) 97.3 F (36.3 C) (Oral)   SpO2 95%   Visual Acuity Right Eye Distance:   Left Eye Distance:   Bilateral Distance:    Right Eye Near:   Left Eye Near:    Bilateral Near:     Physical Exam Vitals and nursing note reviewed.  Constitutional:      Appearance: He is not ill-appearing or toxic-appearing.  HENT:     Head: Normocephalic and atraumatic.     Right Ear: Hearing, tympanic membrane, ear canal and external ear normal.     Left Ear: Hearing, tympanic membrane, ear canal and external ear normal.     Nose: Nose normal.     Mouth/Throat:     Lips: Pink.     Mouth: Mucous membranes are moist. No injury.     Tongue: No lesions. Tongue does not deviate from midline.     Palate: No mass and lesions.     Pharynx: Oropharynx is clear. Uvula midline. No pharyngeal swelling, oropharyngeal exudate, posterior oropharyngeal erythema or uvula  swelling.     Tonsils: No tonsillar exudate or tonsillar abscesses.  Eyes:     General: Lids are normal. Vision grossly intact. Gaze aligned appropriately.     Extraocular Movements: Extraocular movements intact.     Conjunctiva/sclera: Conjunctivae normal.  Cardiovascular:     Rate and Rhythm: Normal rate and regular rhythm.     Heart sounds: Normal heart sounds,  S1 normal and S2 normal.  Pulmonary:     Effort: Pulmonary effort is normal. No respiratory distress.     Breath sounds: Normal breath sounds and air entry. No wheezing, rhonchi or rales.  Chest:     Chest wall: No tenderness.  Musculoskeletal:     Cervical back: Neck supple.  Skin:    General: Skin is warm and dry.     Capillary Refill: Capillary refill takes less than 2 seconds.     Findings: No rash.  Neurological:     General: No focal deficit present.     Mental Status: He is alert and oriented to person, place, and time. Mental status is at baseline.     Cranial Nerves: No dysarthria or facial asymmetry.  Psychiatric:        Mood and Affect: Mood normal.        Speech: Speech normal.        Behavior: Behavior normal.        Thought Content: Thought content normal.        Judgment: Judgment normal.      UC Treatments / Results  Labs (all labs ordered are listed, but only abnormal results are displayed) Labs Reviewed  POC COVID19/FLU A&B COMBO    EKG   Radiology No results found.  Procedures Procedures (including critical care time)  Medications Ordered in UC Medications  ibuprofen (ADVIL) tablet 800 mg (800 mg Oral Given 01/28/23 1331)    Initial Impression / Assessment and Plan / UC Course  I have reviewed the triage vital signs and the nursing notes.  Pertinent labs & imaging results that were available during my care of the patient were reviewed by me and considered in my medical decision making (see chart for details).   1.  Viral URI with cough Suspect viral URI, viral syndrome.  Physical exam findings reassuring, vital signs hemodynamically stable. Low suspicion for pneumonia/acute cardiopulmonary abnormality, therefore deferred imaging of the chest. Advised supportive care, offered prescriptions for symptomatic relief.  Recommend continued use of OTC medications as needed, recommendations discussed with patient/caregiver and outlined in AVS.  Strep/viral testing: COVID-19 and flu POC testing negative.  Counseled patient on potential for adverse effects with medications prescribed/recommended today, strict ER and return-to-clinic precautions discussed, patient verbalized understanding.    Final Clinical Impressions(s) / UC Diagnoses   Final diagnoses:  Viral URI with cough  Former smoker     Discharge Instructions      You have a viral illness which will improve on its own with rest, fluids, and medications to help with your symptoms. We discussed prescriptions that may help with your symptoms: tessalon perles, promethazine DM, albuterol inhaler as needed You may use over the counter medicines as needed: tylenol/motrin, mucinex, zyrtec, Flonase Two teaspoons of honey in 1 cup of warm water every 4-6 hours may help with throat pains. Humidifier in room at nighttime may help soothe cough (clean well daily).   For chest pain, shortness of breath, inability to keep food or fluids down without vomiting, fever that does not respond to tylenol or motrin, or any other severe symptoms, please go to the ER for further evaluation. Return to urgent care as needed, otherwise follow-up with PCP.     ED Prescriptions     Medication Sig Dispense Auth. Provider   albuterol (VENTOLIN HFA) 108 (90 Base) MCG/ACT inhaler Inhale 1-2 puffs into the lungs every 6 (six) hours as needed. 8 g Carlisle Beers, FNP  benzonatate (TESSALON) 100 MG capsule Take 1 capsule (100 mg total) by mouth every 8 (eight) hours. 21 capsule Carlisle Beers, FNP    promethazine-dextromethorphan (PROMETHAZINE-DM) 6.25-15 MG/5ML syrup Take 5 mLs by mouth at bedtime as needed for cough. 118 mL Carlisle Beers, FNP      PDMP not reviewed this encounter.   Carlisle Beers, FNP 01/28/23 1420

## 2023-01-28 NOTE — ED Triage Notes (Signed)
Pt presents to [facility] with c/o last 3-4 days not feeling well, woke up this morning with chest congestion, headache and coughing.

## 2023-01-28 NOTE — Discharge Instructions (Signed)
You have a viral illness which will improve on its own with rest, fluids, and medications to help with your symptoms. We discussed prescriptions that may help with your symptoms: tessalon perles, promethazine DM, albuterol inhaler as needed You may use over the counter medicines as needed: tylenol/motrin, mucinex, zyrtec, Flonase Two teaspoons of honey in 1 cup of warm water every 4-6 hours may help with throat pains. Humidifier in room at nighttime may help soothe cough (clean well daily).   For chest pain, shortness of breath, inability to keep food or fluids down without vomiting, fever that does not respond to tylenol or motrin, or any other severe symptoms, please go to the ER for further evaluation. Return to urgent care as needed, otherwise follow-up with PCP.

## 2023-02-01 ENCOUNTER — Other Ambulatory Visit: Payer: Self-pay | Admitting: Gastroenterology

## 2023-02-01 DIAGNOSIS — R1319 Other dysphagia: Secondary | ICD-10-CM

## 2023-02-01 DIAGNOSIS — K2100 Gastro-esophageal reflux disease with esophagitis, without bleeding: Secondary | ICD-10-CM

## 2023-02-01 DIAGNOSIS — K259 Gastric ulcer, unspecified as acute or chronic, without hemorrhage or perforation: Secondary | ICD-10-CM

## 2023-03-09 ENCOUNTER — Emergency Department (HOSPITAL_COMMUNITY)
Admission: EM | Admit: 2023-03-09 | Discharge: 2023-03-10 | Disposition: A | Discharge status: Home / Self Care | Payer: BC Managed Care – PPO | Attending: Emergency Medicine | Admitting: Emergency Medicine

## 2023-03-09 DIAGNOSIS — F17200 Nicotine dependence, unspecified, uncomplicated: Secondary | ICD-10-CM | POA: Insufficient documentation

## 2023-03-09 DIAGNOSIS — R509 Fever, unspecified: Secondary | ICD-10-CM | POA: Insufficient documentation

## 2023-03-09 DIAGNOSIS — D72829 Elevated white blood cell count, unspecified: Secondary | ICD-10-CM | POA: Insufficient documentation

## 2023-03-09 DIAGNOSIS — R944 Abnormal results of kidney function studies: Secondary | ICD-10-CM | POA: Diagnosis not present

## 2023-03-09 DIAGNOSIS — R000 Tachycardia, unspecified: Secondary | ICD-10-CM | POA: Insufficient documentation

## 2023-03-09 DIAGNOSIS — R6883 Chills (without fever): Secondary | ICD-10-CM

## 2023-03-09 DIAGNOSIS — R7401 Elevation of levels of liver transaminase levels: Secondary | ICD-10-CM | POA: Diagnosis not present

## 2023-03-09 DIAGNOSIS — Z20822 Contact with and (suspected) exposure to covid-19: Secondary | ICD-10-CM | POA: Insufficient documentation

## 2023-03-09 NOTE — ED Triage Notes (Signed)
 Pt reports fever tmax 104 since this am. Recent shoulder surgery. Pt also endorses bodyaches and "flu like symptoms".

## 2023-03-10 ENCOUNTER — Encounter (HOSPITAL_COMMUNITY): Payer: Self-pay

## 2023-03-10 ENCOUNTER — Emergency Department (HOSPITAL_COMMUNITY): Payer: BC Managed Care – PPO

## 2023-03-10 ENCOUNTER — Other Ambulatory Visit: Payer: Self-pay

## 2023-03-10 LAB — URINALYSIS, W/ REFLEX TO CULTURE (INFECTION SUSPECTED)
Bacteria, UA: NONE SEEN
Bilirubin Urine: NEGATIVE
Glucose, UA: NEGATIVE mg/dL
Hgb urine dipstick: NEGATIVE
Ketones, ur: NEGATIVE mg/dL
Leukocytes,Ua: NEGATIVE
Nitrite: NEGATIVE
Protein, ur: NEGATIVE mg/dL
Specific Gravity, Urine: 1.01 (ref 1.005–1.030)
pH: 5 (ref 5.0–8.0)

## 2023-03-10 LAB — COMPREHENSIVE METABOLIC PANEL
ALT: 56 U/L — ABNORMAL HIGH (ref 0–44)
AST: 51 U/L — ABNORMAL HIGH (ref 15–41)
Albumin: 4.5 g/dL (ref 3.5–5.0)
Alkaline Phosphatase: 69 U/L (ref 38–126)
Anion gap: 11 (ref 5–15)
BUN: 19 mg/dL (ref 6–20)
CO2: 24 mmol/L (ref 22–32)
Calcium: 9.5 mg/dL (ref 8.9–10.3)
Chloride: 98 mmol/L (ref 98–111)
Creatinine, Ser: 1.37 mg/dL — ABNORMAL HIGH (ref 0.61–1.24)
GFR, Estimated: >60 mL/min (ref >60)
Glucose, Bld: 95 mg/dL (ref 70–99)
Potassium: 3.8 mmol/L (ref 3.5–5.1)
Sodium: 133 mmol/L — ABNORMAL LOW (ref 135–145)
Total Bilirubin: 1 mg/dL (ref 0.0–1.2)
Total Protein: 7.7 g/dL (ref 6.5–8.1)

## 2023-03-10 LAB — CBC WITH DIFFERENTIAL/PLATELET
Abs Immature Granulocytes: 0.05 10*3/uL (ref 0.00–0.07)
Basophils Absolute: 0.1 10*3/uL (ref 0.0–0.1)
Basophils Relative: 1 %
Eosinophils Absolute: 0.1 10*3/uL (ref 0.0–0.5)
Eosinophils Relative: 1 %
HCT: 46.9 % (ref 39.0–52.0)
Hemoglobin: 16 g/dL (ref 13.0–17.0)
Immature Granulocytes: 0 %
Lymphocytes Relative: 10 %
Lymphs Abs: 1.5 10*3/uL (ref 0.7–4.0)
MCH: 30.7 pg (ref 26.0–34.0)
MCHC: 34.1 g/dL (ref 30.0–36.0)
MCV: 90 fL (ref 80.0–100.0)
Monocytes Absolute: 1.5 10*3/uL — ABNORMAL HIGH (ref 0.1–1.0)
Monocytes Relative: 10 %
Neutro Abs: 11.6 10*3/uL — ABNORMAL HIGH (ref 1.7–7.7)
Neutrophils Relative %: 78 %
Platelets: 233 10*3/uL (ref 150–400)
RBC: 5.21 MIL/uL (ref 4.22–5.81)
RDW: 12 % (ref 11.5–15.5)
WBC: 14.8 10*3/uL — ABNORMAL HIGH (ref 4.0–10.5)
nRBC: 0 % (ref 0.0–0.2)

## 2023-03-10 LAB — I-STAT CG4 LACTIC ACID, ED
Lactic Acid, Venous: 0.8 mmol/L (ref 0.5–1.9)
Lactic Acid, Venous: 0.8 mmol/L (ref 0.5–1.9)

## 2023-03-10 LAB — RESP PANEL BY RT-PCR (RSV, FLU A&B, COVID)  RVPGX2
Influenza A by PCR: NEGATIVE
Influenza B by PCR: NEGATIVE
Resp Syncytial Virus by PCR: NEGATIVE
SARS Coronavirus 2 by RT PCR: NEGATIVE

## 2023-03-10 MED ORDER — IBUPROFEN 800 MG PO TABS
800.0000 mg | ORAL_TABLET | Freq: Once | ORAL | Status: AC
  Administered 2023-03-10: 800 mg via ORAL
  Filled 2023-03-10: qty 1

## 2023-03-10 NOTE — Discharge Instructions (Signed)
 Your symptoms today are most consistent with a viral illness.  You may take a maximum of 4000 mg of Tylenol  from all sources in a 24-hour.  This includes cold/flu preparations.  You may also take ibuprofen  up to 800 mg every 6 hours for fever and pain control.  If you plan to take the ibuprofen  you do need to stop your meloxicam.  If you develop any life-threatening symptoms please return to the emergency department.

## 2023-03-10 NOTE — ED Provider Notes (Signed)
 [place] AT [facility] Provider Note   CSN: [id number] Arrival date & time: 03/09/23  2342     History  Chief Complaint  Patient presents with   Fever    Joel Kelly is a 38 y.o. male.  Patient with past medical history significant for SLAP tear, rotator cuff repair of the right shoulder on December 18 with Dr. Cristy presents to the emergency department complaining of fever and chills which began today.  Patient states his postop course has been grossly unremarkable.  He had a follow-up appointment recently and incision check reportedly looked well.  Patient states that today he went to the gym and after coming home from the gym began to feel chills.  He had a fever at home of 104 F.  He states he began to have body aches all over and some increased pain in the right shoulder after the chills began.  He denies emesis, shortness of breath, chest pain, urinary symptoms.  He does endorse some mild suprapubic pain.  Past medical history otherwise significant for infected olecranon bursa on the right side requiring incision and drainage.   Fever      Home Medications Prior to Admission medications   Medication Sig Start Date End Date Taking? Authorizing Provider  albuterol  (VENTOLIN  HFA) 108 (90 Base) MCG/ACT inhaler Inhale 1-2 puffs into the lungs every 6 (six) hours as needed. 01/28/23   Enedelia Dorna HERO, FNP  anastrozole (ARIMIDEX) 1 MG tablet Take 1 mg by mouth once a week. Tuesday 08/22/21   [provider]  benzonatate  (TESSALON ) 100 MG capsule Take 1 capsule (100 mg total) by mouth every 8 (eight) hours. 01/28/23   Enedelia Dorna HERO, FNP  doxycycline  (VIBRAMYCIN ) 100 MG capsule Take 100 mg by mouth 2 (two) times daily. 05/09/22   [provider]  fluticasone  furoate-vilanterol (BREO ELLIPTA ) 200-25 MCG/ACT AEPB Inhale 1 puff into the lungs daily. 05/07/22   Parrett, Madelin RAMAN, NP  ibuprofen  (ADVIL ) 200 MG tablet Take 600 mg by  mouth every 6 (six) hours as needed for mild pain.    [provider]  omeprazole  (PRILOSEC) 40 MG capsule TAKE 1 CAPSULE BY MOUTH 2 TIMES A DAY BEFORE A MEAL FOR 8 WEEKS 11/08/22   Stacia Glendia BRAVO, MD  promethazine -dextromethorphan (PROMETHAZINE -DM) 6.25-15 MG/5ML syrup Take 5 mLs by mouth at bedtime as needed for cough. 01/28/23   Enedelia Dorna HERO, FNP      Allergies    Patient has no known allergies.    Review of Systems   Review of Systems  Constitutional:  Positive for fever.    Physical Exam Updated Vital Signs BP 122/81   Pulse 82   Temp 99.5 F (37.5 C) (Oral)   Resp (!) 21   Ht 5' 10 (1.778 m)   Wt 95.3 kg   SpO2 95%   BMI 30.13 kg/m  Physical Exam Vitals and nursing note reviewed.  Constitutional:      General: He is not in acute distress.    Appearance: He is well-developed.  HENT:     Head: Normocephalic and atraumatic.  Eyes:     Conjunctiva/sclera: Conjunctivae normal.  Cardiovascular:     Rate and Rhythm: Regular rhythm. Tachycardia present.     Heart sounds: No murmur heard. Pulmonary:     Effort: Pulmonary effort is normal. No respiratory distress.     Breath sounds: Normal breath sounds.  Abdominal:     Palpations: Abdomen is soft.  Tenderness: There is no abdominal tenderness.  Musculoskeletal:        General: No swelling, tenderness or deformity.     Cervical back: Neck supple.     Comments: Well-healed incisions from portal sites.  No significant tenderness or swelling to the right shoulder appreciated.  Skin:    General: Skin is warm and dry.     Capillary Refill: Capillary refill takes less than 2 seconds.  Neurological:     Mental Status: He is alert.  Psychiatric:        Mood and Affect: Mood normal.     ED Results / Procedures / Treatments   Labs (all labs ordered are listed, but only abnormal results are displayed) Labs Reviewed  COMPREHENSIVE METABOLIC PANEL - Abnormal; Notable for the following components:       Result Value   Sodium 133 (*)    Creatinine, Ser 1.37 (*)    AST 51 (*)    ALT 56 (*)    All other components within normal limits  CBC WITH DIFFERENTIAL/PLATELET - Abnormal; Notable for the following components:   WBC 14.8 (*)    Neutro Abs 11.6 (*)    Monocytes Absolute 1.5 (*)    All other components within normal limits  URINALYSIS, W/ REFLEX TO CULTURE (INFECTION SUSPECTED) - Abnormal; Notable for the following components:   Color, Urine STRAW (*)    All other components within normal limits  RESP PANEL BY RT-PCR (RSV, FLU A&B, COVID)  RVPGX2  CULTURE, BLOOD (ROUTINE X 2)  CULTURE, BLOOD (ROUTINE X 2)  I-STAT CG4 LACTIC ACID, ED  I-STAT CG4 LACTIC ACID, ED    EKG None  Radiology DG Shoulder Right Portable Result Date: 03/10/2023 CLINICAL DATA:  Right shoulder pain, history of previous surgery, initial encounter EXAM: RIGHT SHOULDER - 1 VIEW COMPARISON:  None Available. FINDINGS: There is no evidence of fracture or dislocation. There is no evidence of arthropathy or other focal bone abnormality. Soft tissues are unremarkable. IMPRESSION: No acute abnormality noted Electronically Signed   By: Oneil Devonshire M.D.   On: 03/10/2023 00:59   DG Chest Port 1 View Result Date: 03/10/2023 CLINICAL DATA:  Fevers EXAM: PORTABLE CHEST 1 VIEW COMPARISON:  05/07/2022 FINDINGS: The heart size and mediastinal contours are within normal limits. Both lungs are clear. The visualized skeletal structures are unremarkable. IMPRESSION: No active disease. Electronically Signed   By: Oneil Devonshire M.D.   On: 03/10/2023 00:59    Procedures Procedures    Medications Ordered in ED Medications  ibuprofen  (ADVIL ) tablet 800 mg (800 mg Oral Given 03/10/23 0206)    ED Course/ Medical Decision Making/ A&P                                 Medical Decision Making Amount and/or Complexity of Data Reviewed Labs: ordered. Radiology: ordered.  Risk Prescription drug management.   This patient  presents to the ED for concern of fever and chills, this involves an extensive number of treatment options, and is a complaint that carries with it a high risk of complications and morbidity.  The differential diagnosis includes viral infection including COVID, RSV, flu, and others, postoperative complication/infection, others   Co morbidities that complicate the patient evaluation  Recent right shoulder surgery   Additional history obtained:  Additional history obtained from visitor at bedside External records from outside source obtained and reviewed including chart review   Lab  Tests:  I Ordered, and personally interpreted labs.  The pertinent results include: Leukocytosis with white count of 14,800, mildly elevated AST and ALT at 51 and 56 respectively, creatinine 1.37, lactic acid 0.8, negative respiratory panel   Imaging Studies ordered:  I ordered imaging studies including plain film of the right shoulder and chest I independently visualized and interpreted imaging which showed no acute findings I agree with the radiologist interpretation   Problem List / ED Course / Critical interventions / Medication management   I ordered medication including ibuprofen  for pain and fever  Reevaluation of the patient after these medicines showed that the patient improved I have reviewed the patients home medicines and have made adjustments as needed   Social Determinants of Health:  Patient smokes tobacco   Test / Admission - Considered:  Workup reassuring overall.  Negative respiratory panel but symptoms seem most consistent with viral illness.  Right shoulder has well-healed incisions, no overlying erythema, no signs of infection at this time.  Negative chest x-ray.  Negative shoulder x-ray.  Mild leukocytosis of unknown significance.  Plan to discharge home at this time with return precautions and recommendations for treatment with ibuprofen  and acetaminophen .  Patient states he  is going to discontinue his meloxicam prior to taking the ibuprofen  at his request.  No indication at this time for admission.         Final Clinical Impression(s) / ED Diagnoses Final diagnoses:  Fever, unspecified fever cause  Chills    Rx / DC Orders ED Discharge Orders     None         Logan Ubaldo KATHEE DEVONNA 03/10/23 0451    Griselda Norris, MD 03/10/23 [phone]

## 2023-03-15 LAB — CULTURE, BLOOD (ROUTINE X 2)
Culture: NO GROWTH
Culture: NO GROWTH
Special Requests: ADEQUATE
Special Requests: ADEQUATE

## 2023-03-28 ENCOUNTER — Ambulatory Visit
Admission: EM | Admit: 2023-03-28 | Discharge: 2023-03-28 | Disposition: A | Discharge status: Home / Self Care | Payer: BC Managed Care – PPO | Attending: Family Medicine | Admitting: Family Medicine

## 2023-03-28 ENCOUNTER — Other Ambulatory Visit: Payer: Self-pay

## 2023-03-28 DIAGNOSIS — R300 Dysuria: Secondary | ICD-10-CM | POA: Insufficient documentation

## 2023-03-28 LAB — POCT URINALYSIS DIP (MANUAL ENTRY)
Bilirubin, UA: NEGATIVE
Glucose, UA: NEGATIVE mg/dL
Ketones, POC UA: NEGATIVE mg/dL
Leukocytes, UA: NEGATIVE
Nitrite, UA: NEGATIVE
Protein Ur, POC: 30 mg/dL — AB
Spec Grav, UA: 1.025
Urobilinogen, UA: 0.2 U/dL
pH, UA: 5.5

## 2023-03-28 NOTE — ED Triage Notes (Signed)
Patient C/O polyuria and "itching". Patient states he thinks he has a UTI. Denies penile discharge. Patient states his urinary stream has lessened. Symptoms x one week. Patient has not taken any OTC meds for this and states he partner had a "recent UTI".

## 2023-03-28 NOTE — Discharge Instructions (Addendum)
Make sure you hydrate very well with plain water and a quantity of 80 ounces of water a day.  Please limit drinks that are considered urinary irritants such as soda, sweet tea, coffee, energy drinks, alcohol.  These can worsen your urinary and genital symptoms but also be the source of them.  I will let you know about your urine culture and penile swab results through MyChart to see if we need to prescribe or change your antibiotics based off of those results.  

## 2023-03-28 NOTE — ED Provider Notes (Signed)
[facility]  Note:  This document was prepared using Conservation officer, historic buildings and may include unintentional dictation errors.  MRN: [id number] DOB: 08-25-1985  Subjective:   Joel Kelly is a 38 y.o. male presenting for nocturia, dysuria and itching. Denies hematuria, penile discharge, penile swelling, testicular pain, testicular swelling, anal pain, groin pain. His sex partner had an UTI.  Patient does drink coffee consistently in large quantities.  Tries to drink about 64 ounces of water daily.  No concern for STI but is not opposed to testing.  No current facility-administered medications for this encounter.  Current Outpatient Medications:    albuterol (VENTOLIN HFA) 108 (90 Base) MCG/ACT inhaler, Inhale 1-2 puffs into the lungs every 6 (six) hours as needed., Disp: 8 g, Rfl: 0   anastrozole (ARIMIDEX) 1 MG tablet, Take 1 mg by mouth once a week. Tuesday, Disp: , Rfl:    benzonatate (TESSALON) 100 MG capsule, Take 1 capsule (100 mg total) by mouth every 8 (eight) hours., Disp: 21 capsule, Rfl: 0   doxycycline (VIBRAMYCIN) 100 MG capsule, Take 100 mg by mouth 2 (two) times daily., Disp: , Rfl:    fluticasone furoate-vilanterol (BREO ELLIPTA) 200-25 MCG/ACT AEPB, Inhale 1 puff into the lungs daily., Disp: 1 each, Rfl: 5   ibuprofen (ADVIL) 200 MG tablet, Take 600 mg by mouth every 6 (six) hours as needed for mild pain., Disp: , Rfl:    omeprazole (PRILOSEC) 40 MG capsule, TAKE 1 CAPSULE BY MOUTH 2 TIMES A DAY BEFORE A MEAL FOR 8 WEEKS, Disp: 180 capsule, Rfl: 0   promethazine-dextromethorphan (PROMETHAZINE-DM) 6.25-15 MG/5ML syrup, Take 5 mLs by mouth at bedtime as needed for cough., Disp: 118 mL, Rfl: 0   No Known Allergies  Past Medical History:  Diagnosis Date   Asthma    GERD (gastroesophageal reflux disease)    TUMS as needed   Lateral meniscal tear 10/2015   right knee   Loose body of right knee 10/2015     Past Surgical History:   Procedure Laterality Date   BICEPS TENDON REPAIR     HERNIA REPAIR  2023   IRRIGATION AND DEBRIDEMENT ELBOW Right 09/19/2021   Procedure: IRRIGATION AND DEBRIDEMENT RIGHT ELBOW;  Surgeon: Dairl Ponder, MD;  Location: [facility];  Service: Orthopedics;  Laterality: Right;   KNEE ARTHROSCOPY W/ ACL RECONSTRUCTION Right 10/14/2009   KNEE ARTHROSCOPY W/ ACL RECONSTRUCTION Left    KNEE ARTHROSCOPY W/ ACL RECONSTRUCTION Bilateral    2018   KNEE ARTHROSCOPY WITH LATERAL MENISECTOMY Right 11/02/2015   Procedure: Right KNEE ARTHROSCOPY WITH LATERAL MENISCECTOMY and removal of foreign body, microfracture, chondroplasty;  Surgeon: Salvatore Marvel, MD;  Location: [facility];  Service: Orthopedics;  Laterality: Right;  Right KNEE ARTHROSCOPY WITH LATERAL MENISCECTOMY and removal of foreign body, microfracture, chondroplasty   KNEE ARTHROSCOPY WITH MENISCAL REPAIR Right    LABRAL REPAIR      Family History  Problem Relation Age of Onset   Renal cancer Father    Liver disease Neg Hx    Colon cancer Neg Hx    Esophageal cancer Neg Hx     Social History   Tobacco Use   Smoking status: Former    Types: Cigarettes   Smokeless tobacco: Current    Types: Chew  Vaping Use   Vaping status: Some Days   Substances: Flavoring, Mixture of cannabinoids  Substance Use Topics   Alcohol use: Not Currently    Comment: occasionally   Drug  use: No    ROS   Objective:   Vitals: BP 123/63 (BP Location: Left Arm)   Pulse 70   Temp (!) 97.5 F (36.4 C) (Oral)   Resp 16   Wt 210 lb (95.3 kg)   SpO2 96%   BMI 30.13 kg/m   Physical Exam Constitutional:      General: He is not in acute distress.    Appearance: Normal appearance. He is well-developed and normal weight. He is not ill-appearing, toxic-appearing or diaphoretic.  HENT:     Head: Normocephalic and atraumatic.     Right Ear: External ear normal.     Left Ear: External ear normal.     Nose: Nose normal.     Mouth/Throat:      Pharynx: Oropharynx is clear.  Eyes:     General: No scleral icterus.       Right eye: No discharge.        Left eye: No discharge.     Extraocular Movements: Extraocular movements intact.  Cardiovascular:     Rate and Rhythm: Normal rate.  Pulmonary:     Effort: Pulmonary effort is normal.  Musculoskeletal:     Cervical back: Normal range of motion.  Neurological:     Mental Status: He is alert and oriented to person, place, and time.  Psychiatric:        Mood and Affect: Mood normal.        Behavior: Behavior normal.        Thought Content: Thought content normal.        Judgment: Judgment normal.     Results for orders placed or performed during the hospital encounter of 03/28/23 (from the past 24 hours)  POCT urinalysis dipstick     Status: Abnormal   Collection Time: 03/28/23  1:45 PM  Result Value Ref Range   Color, UA yellow    Clarity, UA clear    Glucose, UA negative mg/dL   Bilirubin, UA negative    Ketones, POC UA negative mg/dL   Spec Grav, UA 1.027    Blood, UA trace-intact (A)    pH, UA 5.5    Protein Ur, POC =30 (A) mg/dL   Urobilinogen, UA 0.2 E.U./dL   Nitrite, UA Negative    Leukocytes, UA Negative     Assessment and Plan :   PDMP not reviewed this encounter.  1. Dysuria    Recommended hydrating more consistently.  Limit urinary irritants.  Will base further treatment off of test results.   Wallis Bamberg, [place] 03/28/23 2536

## 2023-03-29 ENCOUNTER — Telehealth (HOSPITAL_BASED_OUTPATIENT_CLINIC_OR_DEPARTMENT_OTHER): Payer: Self-pay

## 2023-03-29 LAB — CYTOLOGY, (ORAL, ANAL, URETHRAL) ANCILLARY ONLY
Chlamydia: POSITIVE — AB
Comment: NEGATIVE
Comment: NEGATIVE
Comment: NORMAL
Neisseria Gonorrhea: NEGATIVE
Trichomonas: NEGATIVE

## 2023-03-29 MED ORDER — DOXYCYCLINE HYCLATE 100 MG PO CAPS: 100.0000 mg | ORAL_CAPSULE | Freq: Two times a day (BID) | ORAL | 0 refills | Status: DC

## 2023-03-29 NOTE — Telephone Encounter (Signed)
Per protocol, pt requires tx with Doxycycline.  Reviewed with patient, verified pharmacy, prescription sent.

## 2023-03-30 LAB — URINE CULTURE: Culture: NO GROWTH

## 2023-04-26 ENCOUNTER — Ambulatory Visit: Payer: BC Managed Care – PPO | Admitting: Gastroenterology

## 2023-05-02 ENCOUNTER — Encounter: Payer: Self-pay | Admitting: Adult Health

## 2023-05-03 ENCOUNTER — Ambulatory Visit (HOSPITAL_BASED_OUTPATIENT_CLINIC_OR_DEPARTMENT_OTHER)
Admission: RE | Admit: 2023-05-03 | Discharge: 2023-05-03 | Disposition: A | Discharge status: Home / Self Care | Payer: BC Managed Care – PPO | Source: Ambulatory Visit | Attending: Urgent Care | Admitting: Urgent Care

## 2023-05-03 ENCOUNTER — Ambulatory Visit
Admission: EM | Admit: 2023-05-03 | Discharge: 2023-05-03 | Disposition: A | Discharge status: Home / Self Care | Payer: BC Managed Care – PPO | Attending: Family Medicine | Admitting: Family Medicine

## 2023-05-03 DIAGNOSIS — B9789 Other viral agents as the cause of diseases classified elsewhere: Secondary | ICD-10-CM | POA: Insufficient documentation

## 2023-05-03 DIAGNOSIS — J4540 Moderate persistent asthma, uncomplicated: Secondary | ICD-10-CM | POA: Insufficient documentation

## 2023-05-03 DIAGNOSIS — J988 Other specified respiratory disorders: Secondary | ICD-10-CM | POA: Diagnosis present

## 2023-05-03 DIAGNOSIS — R0902 Hypoxemia: Secondary | ICD-10-CM | POA: Insufficient documentation

## 2023-05-03 MED ORDER — PREDNISONE 20 MG PO TABS: ORAL_TABLET | ORAL | 0 refills | Status: DC

## 2023-05-03 MED ORDER — CETIRIZINE HCL 10 MG PO TABS: 10.0000 mg | ORAL_TABLET | Freq: Every day | ORAL | 0 refills | Status: DC

## 2023-05-03 MED ORDER — PROMETHAZINE-DM 6.25-15 MG/5ML PO SYRP: 5.0000 mL | ORAL_SOLUTION | Freq: Three times a day (TID) | ORAL | 0 refills | Status: DC | PRN

## 2023-05-03 NOTE — ED Triage Notes (Signed)
 Pt reports nasal congestion, dry cough, dry throat x 5 days. Pt not taking meds fro complaints.

## 2023-05-03 NOTE — ED Provider Notes (Signed)
 [facility]  Note:  This document was prepared using Conservation officer, historic buildings and may include unintentional dictation errors.  MRN: [id number] DOB: [birth year]  Subjective:   Joel Kelly is a 38 y.o. male presenting for 5-day history of persistent sinus congestion, sinus drainage, dry coughing, scratchy sore throat.  No overt chest pain, shortness of breath or wheezing.  He has moderate persistent asthma, supposed to take [facility] daily but admits he is not as consistent.  Patient just had a surgery earlier this week on Monday.  No complications.  No current facility-administered medications for this encounter.  Current Outpatient Medications:    albuterol (VENTOLIN HFA) 108 (90 Base) MCG/ACT inhaler, Inhale 1-2 puffs into the lungs every 6 (six) hours as needed., Disp: 8 g, Rfl: 0   anastrozole (ARIMIDEX) 1 MG tablet, Take 1 mg by mouth once a week. Tuesday, Disp: , Rfl:    benzonatate (TESSALON) 100 MG capsule, Take 1 capsule (100 mg total) by mouth every 8 (eight) hours., Disp: 21 capsule, Rfl: 0   doxycycline (VIBRAMYCIN) 100 MG capsule, Take 100 mg by mouth 2 (two) times daily., Disp: , Rfl:    doxycycline (VIBRAMYCIN) 100 MG capsule, Take 1 capsule (100 mg total) by mouth 2 (two) times daily., Disp: 14 capsule, Rfl: 0   fluticasone furoate-vilanterol (BREO ELLIPTA) 200-25 MCG/ACT AEPB, Inhale 1 puff into the lungs daily., Disp: 1 each, Rfl: 5   ibuprofen (ADVIL) 200 MG tablet, Take 600 mg by mouth every 6 (six) hours as needed for mild pain., Disp: , Rfl:    omeprazole (PRILOSEC) 40 MG capsule, TAKE 1 CAPSULE BY MOUTH 2 TIMES A DAY BEFORE A MEAL FOR 8 WEEKS, Disp: 180 capsule, Rfl: 0   promethazine-dextromethorphan (PROMETHAZINE-DM) 6.25-15 MG/5ML syrup, Take 5 mLs by mouth at bedtime as needed for cough., Disp: 118 mL, Rfl: 0   No Known Allergies  Past Medical History:  Diagnosis Date   Asthma    GERD (gastroesophageal reflux disease)    TUMS  as needed   Lateral meniscal tear 10/2015   right knee   Loose body of right knee 10/2015     Past Surgical History:  Procedure Laterality Date   BICEPS TENDON REPAIR     HERNIA REPAIR  2023   IRRIGATION AND DEBRIDEMENT ELBOW Right 09/19/2021   Procedure: IRRIGATION AND DEBRIDEMENT RIGHT ELBOW;  Surgeon: Dairl Ponder, MD;  Location: [facility];  Service: Orthopedics;  Laterality: Right;   KNEE ARTHROSCOPY W/ ACL RECONSTRUCTION Right 10/14/2009   KNEE ARTHROSCOPY W/ ACL RECONSTRUCTION Left    KNEE ARTHROSCOPY W/ ACL RECONSTRUCTION Bilateral    2018   KNEE ARTHROSCOPY WITH LATERAL MENISECTOMY Right 11/02/2015   Procedure: Right KNEE ARTHROSCOPY WITH LATERAL MENISCECTOMY and removal of foreign body, microfracture, chondroplasty;  Surgeon: Salvatore Marvel, MD;  Location: [facility];  Service: Orthopedics;  Laterality: Right;  Right KNEE ARTHROSCOPY WITH LATERAL MENISCECTOMY and removal of foreign body, microfracture, chondroplasty   KNEE ARTHROSCOPY WITH MENISCAL REPAIR Right    LABRAL REPAIR      Family History  Problem Relation Age of Onset   Renal cancer Father    Liver disease Neg Hx    Colon cancer Neg Hx    Esophageal cancer Neg Hx     Social History   Tobacco Use   Smoking status: Former    Types: Cigarettes   Smokeless tobacco: Current    Types: Chew  Vaping Use   Vaping status: Some  Days   Substances: Flavoring, Mixture of cannabinoids  Substance Use Topics   Alcohol use: Not Currently    Comment: occasionally   Drug use: No    ROS   Objective:   Vitals: BP 120/78 (BP Location: Left Arm)   Pulse 85   Temp 98.4 F (36.9 C) (Oral)   Resp 18   SpO2 91%   Physical Exam Constitutional:      General: He is not in acute distress.    Appearance: Normal appearance. He is well-developed and normal weight. He is not ill-appearing, toxic-appearing or diaphoretic.  HENT:     Head: Normocephalic and atraumatic.     Right Ear: Tympanic membrane, ear  canal and external ear normal. No drainage, swelling or tenderness. No middle ear effusion. There is no impacted cerumen. Tympanic membrane is not erythematous or bulging.     Left Ear: Tympanic membrane, ear canal and external ear normal. No drainage, swelling or tenderness.  No middle ear effusion. There is no impacted cerumen. Tympanic membrane is not erythematous or bulging.     Nose: Congestion and rhinorrhea present.     Mouth/Throat:     Mouth: Mucous membranes are moist.     Pharynx: No oropharyngeal exudate or posterior oropharyngeal erythema.  Eyes:     General: No scleral icterus.       Right eye: No discharge.        Left eye: No discharge.     Extraocular Movements: Extraocular movements intact.     Conjunctiva/sclera: Conjunctivae normal.  Cardiovascular:     Rate and Rhythm: Normal rate and regular rhythm.     Heart sounds: Normal heart sounds. No murmur heard.    No friction rub. No gallop.  Pulmonary:     Effort: Pulmonary effort is normal. No respiratory distress.     Breath sounds: No stridor. No wheezing, rhonchi or rales.     Comments: Decreased lung sounds in bibasilar fields.   Musculoskeletal:     Cervical back: Normal range of motion and neck supple. No rigidity. No muscular tenderness.  Neurological:     General: No focal deficit present.     Mental Status: He is alert and oriented to person, place, and time.  Psychiatric:        Mood and Affect: Mood normal.        Behavior: Behavior normal.        Thought Content: Thought content normal.        Judgment: Judgment normal.     Assessment and Plan :   PDMP not reviewed this encounter.  1. Viral respiratory infection   2. Hypoxia   3. Moderate persistent asthma, uncomplicated    Will pursue outpatient imaging.  Given his borderline hypoxia in the context of his asthma recommend a prednisone course.  Recommend supportive care for an acute viral syndrome.  Emphasized medical compliance with his inhalers.   Counseled patient on potential for adverse effects with medications prescribed/recommended today, ER and return-to-clinic precautions discussed, patient verbalized understanding.    Wallis Bamberg, [place] 05/03/23 1553

## 2023-05-03 NOTE — Discharge Instructions (Signed)
 I have placed orders to have an x-ray done at the med center in [facility].  Please had there now.  Go through the main hospital and not the emergency room.  Once you are there and let them know that you will came to our clinic and we send she to their facility for an outpatient x-ray.  If no one is at the front desk then they are likely out the rest of the day and at that point you would have to go through the emergency room.  Do not check in as a patient through the emergency room.  Simply let them know that you are there for an outpatient x-ray from our clinic.  I will call you with your results and update our treatment plan if necessary after I get the report.    Start prednisone and maintain your albuterol inhaler use. Use cough syrup as needed.

## 2023-06-28 ENCOUNTER — Encounter: Payer: Self-pay | Admitting: Gastroenterology

## 2023-06-28 ENCOUNTER — Ambulatory Visit (INDEPENDENT_AMBULATORY_CARE_PROVIDER_SITE_OTHER): Payer: BC Managed Care – PPO | Admitting: Gastroenterology

## 2023-06-28 VITALS — BP 118/72 | HR 94 | Ht 70.0 in | Wt 207.0 lb

## 2023-06-28 DIAGNOSIS — R1319 Other dysphagia: Secondary | ICD-10-CM

## 2023-06-28 DIAGNOSIS — R1310 Dysphagia, unspecified: Secondary | ICD-10-CM | POA: Diagnosis not present

## 2023-06-28 DIAGNOSIS — K259 Gastric ulcer, unspecified as acute or chronic, without hemorrhage or perforation: Secondary | ICD-10-CM

## 2023-06-28 DIAGNOSIS — Z8711 Personal history of peptic ulcer disease: Secondary | ICD-10-CM

## 2023-06-28 DIAGNOSIS — K2100 Gastro-esophageal reflux disease with esophagitis, without bleeding: Secondary | ICD-10-CM | POA: Diagnosis not present

## 2023-06-28 DIAGNOSIS — F1021 Alcohol dependence, in remission: Secondary | ICD-10-CM

## 2023-06-28 DIAGNOSIS — K449 Diaphragmatic hernia without obstruction or gangrene: Secondary | ICD-10-CM

## 2023-06-28 NOTE — Patient Instructions (Signed)
 You have been scheduled for an endoscopy. Please follow written instructions given to you at your visit today.  If you use inhalers (even only as needed), please bring them with you on the day of your procedure.  If you take any of the following medications, they will need to be adjusted prior to your procedure:   DO NOT TAKE 7 DAYS PRIOR TO TEST- Trulicity (dulaglutide) Ozempic, Wegovy (semaglutide) Mounjaro (tirzepatide) Bydureon Bcise (exanatide extended release)  DO NOT TAKE 1 DAY PRIOR TO YOUR TEST Rybelsus (semaglutide) Adlyxin (lixisenatide) Victoza (liraglutide) Byetta (exanatide) ___________________________________________________________________________   _______________________________________________________  If your blood pressure at your visit was 140/90 or greater, please contact your primary care physician to follow up on this.  _______________________________________________________  If you are age 71 or older, your body mass index should be between 23-30. Your Body mass index is 29.7 kg/m. If this is out of the aforementioned range listed, please consider follow up with your Primary Care Provider.  If you are age 73 or younger, your body mass index should be between 19-25. Your Body mass index is 29.7 kg/m. If this is out of the aformentioned range listed, please consider follow up with your Primary Care Provider.   ________________________________________________________  The [facility] providers would like to encourage you to use MYCHART to communicate with providers for non-urgent requests or questions.  Due to long hold times on the telephone, sending your provider a message by [facility] may be a faster and more efficient way to get a response.  Please allow 48 business hours for a response.  Please remember that this is for non-urgent requests.  _______________________________________________________   It was a pleasure to see you today!  Thank you for  trusting me with your gastrointestinal care!

## 2023-06-28 NOTE — Progress Notes (Signed)
 Discussed the use of AI scribe software for clinical note transcription with the patient, who gave verbal consent to proceed.  HPI : Joel Kelly is a 38 year old male with a history of GERD with esophagitis who presents with recurrence of dysphagia.  He had his first upper endoscopy a year ago to evaluate dysphagia, which revealed LA Grade B esophagitis, a 4 cm hiatal hernia and small gastric ulcers. A widely patent Schatzki ring was dilated.   Biopsies were negative for H. Pylori infection and eosinophilic esophagitis.  A follow-up endoscopy was recommended in eight weeks to ensure healing of the ulcers and esophagitis, but it was not completed.  He is now presenting for a follow-up to assess the status of the ulcers and esophagitis.  His main complaint last year was food getting stuck, which he has noticed more recently. He has been taking omeprazole  consistently, but does miss doses occasionally. Despite this, he continues to experience reflux symptoms, which have been more consistent over the past couple of weeks. Previously, he experienced reflux a couple of times a week. His symptoms include an acid taste in the mouth and a sensation of food coming up, which now occur with mild foods as well. Reflux symptoms are more pronounced in the evening.  No abdominal pain, nausea, vomiting, and he reports regular bowel movements without blood. He has undergone multiple surgeries in the past, including both shoulders operated on in December and February, and has used pain medications including acetaminophen  and ibuprofen .  He has a history of heavy alcohol use but has been sober for nearly three years after attending rehab. He does not smoke and has no significant family history of cancer, although there is some family history of non-colon cancer.      EGD June 06, 2022 - The examined portions of the nasopharynx, oropharynx and larynx were normal.  - LA Grade B reflux esophagitis with no bleeding.   - Widely patent Schatzki ring. Dilated.  - Gastroesophageal flap valve classified as Hill Grade IV (no fold, wide open lumen, hiatal hernia present).  - 4 cm hiatal hernia.  - Non-bleeding gastric ulcers with no stigmata of bleeding. Biopsied.  - Normal examined duodenum.  - Biopsies were taken with a cold forceps for evaluation of eosinophilic esophagitis.  - Biopsies were taken with a cold forceps for Helicobacter pylori testing.  1. Surgical [P], gastric ulcer biopsies - GASTRIC ANTRAL MUCOSA WITH NONSPECIFIC REACTIVE GASTROPATHY - NEGATIVE FOR INTESTINAL METAPLASIA OR DYSPLASIA - HELICOBACTER PYLORI-LIKE ORGANISMS ARE NOT IDENTIFIED ON ROUTINE H&E STAIN 2. Surgical [P], gastric biopsies - GASTRIC ANTRAL MUCOSA WITH MILD NONSPECIFIC REACTIVE GASTROPATHY - HELICOBACTER PYLORI-LIKE ORGANISMS ARE NOT IDENTIFIED ON ROUTINE H&E STAIN 3. Surgical [P], distal esophagus - ESOPHAGEAL SQUAMOUS MUCOSA WITH MILD VASCULAR CONGESTION, AND FOCAL SQUAMOUS BALLOONING, SUGGESTIVE OF REFLUX ESOPHAGITIS - NEGATIVE FOR INCREASED INTRAEPITHELIAL EOSINOPHILS 4. Surgical [P], proximal esophagus - ESOPHAGEAL SQUAMOUS MUCOSA WITH MILD VASCULAR CONGESTION, AND FOCAL SQUAMOUS BALLOONING, SUGGESTIVE OF REFLUX ESOPHAGITIS - NEGATIVE FOR INCREASED INTRAEPITHELIAL EOSINOPHILS   Past Medical History:  Diagnosis Date   Asthma    GERD (gastroesophageal reflux disease)    TUMS as needed   Lateral meniscal tear 10/2015   right knee   Loose body of right knee 10/2015     Past Surgical History:  Procedure Laterality Date   BICEPS TENDON REPAIR     HERNIA REPAIR  2023   IRRIGATION AND DEBRIDEMENT ELBOW Right 09/19/2021   Procedure: IRRIGATION AND DEBRIDEMENT RIGHT ELBOW;  Surgeon: Florida Hurter, MD;  Location: [facility] OR;  Service: Orthopedics;  Laterality: Right;   KNEE ARTHROSCOPY W/ ACL RECONSTRUCTION Right 10/14/2009   KNEE ARTHROSCOPY W/ ACL RECONSTRUCTION Left    KNEE ARTHROSCOPY W/ ACL RECONSTRUCTION  Bilateral    2018   KNEE ARTHROSCOPY WITH LATERAL MENISECTOMY Right 11/02/2015   Procedure: Right KNEE ARTHROSCOPY WITH LATERAL MENISCECTOMY and removal of foreign body, microfracture, chondroplasty;  Surgeon: Elly Habermann, MD;  Location: [facility];  Service: Orthopedics;  Laterality: Right;  Right KNEE ARTHROSCOPY WITH LATERAL MENISCECTOMY and removal of foreign body, microfracture, chondroplasty   KNEE ARTHROSCOPY WITH MENISCAL REPAIR Right    LABRAL REPAIR     Family History  Problem Relation Age of Onset   Renal cancer Father    Liver disease Neg Hx    Colon cancer Neg Hx    Esophageal cancer Neg Hx    Social History   Tobacco Use   Smoking status: Former    Types: Cigarettes   Smokeless tobacco: Current    Types: Chew  Vaping Use   Vaping status: Some Days   Substances: Flavoring, Mixture of cannabinoids  Substance Use Topics   Alcohol use: Not Currently    Comment: occasionally   Drug use: No   Current Outpatient Medications  Medication Sig Dispense Refill   anastrozole (ARIMIDEX) 1 MG tablet Take 1 mg by mouth once a week. Tuesday     fluticasone  furoate-vilanterol (BREO ELLIPTA ) 200-25 MCG/ACT AEPB Inhale 1 puff into the lungs daily. 1 each 5   omeprazole  (PRILOSEC) 40 MG capsule TAKE 1 CAPSULE BY MOUTH 2 TIMES A DAY BEFORE A MEAL FOR 8 WEEKS 180 capsule 0   albuterol  (VENTOLIN  HFA) 108 (90 Base) MCG/ACT inhaler Inhale 1-2 puffs into the lungs every 6 (six) hours as needed. 8 g 0   benzonatate  (TESSALON ) 100 MG capsule Take 1 capsule (100 mg total) by mouth every 8 (eight) hours. 21 capsule 0   cetirizine  (ZYRTEC  ALLERGY) 10 MG tablet Take 1 tablet (10 mg total) by mouth daily. 30 tablet 0   doxycycline  (VIBRAMYCIN ) 100 MG capsule Take 100 mg by mouth 2 (two) times daily.     doxycycline  (VIBRAMYCIN ) 100 MG capsule Take 1 capsule (100 mg total) by mouth 2 (two) times daily. 14 capsule 0   ibuprofen  (ADVIL ) 200 MG tablet Take 600 mg by mouth every 6  (six) hours as needed for mild pain.     predniSONE  (DELTASONE ) 20 MG tablet Take 2 tablets daily with breakfast. 10 tablet 0   promethazine -dextromethorphan (PROMETHAZINE -DM) 6.25-15 MG/5ML syrup Take 5 mLs by mouth 3 (three) times daily as needed for cough. 200 mL 0   No current facility-administered medications for this visit.   No Known Allergies   Review of Systems: All systems reviewed and negative except where noted in HPI.    No results found.  Physical Exam: BP 118/72   Pulse 94   Ht 5\' 10"  (1.778 m)   Wt 207 lb (93.9 kg)   BMI 29.70 kg/m  Constitutional: Pleasant,well-developed, Caucasian male in no acute distress. HEENT: Normocephalic and atraumatic. Conjunctivae are normal. No scleral icterus. Neck supple.  Cardiovascular: Normal rate, regular rhythm.  Pulmonary/chest: Effort normal and breath sounds normal. No wheezing, rales or rhonchi. Abdominal: Soft, nondistended, nontender. Bowel sounds active throughout. There are no masses palpable. No hepatomegaly. Extremities: no edema Neurological: Alert and oriented to person place and time. Skin: Skin is warm and dry. No rashes noted. Psychiatric: Normal  mood and affect. Behavior is normal.  CBC    Component Value Date/Time   WBC 14.8 (H) 03/10/2023 0014   RBC 5.21 03/10/2023 0014   HGB 16.0 03/10/2023 0014   HCT 46.9 03/10/2023 0014   PLT 233 03/10/2023 0014   MCV 90.0 03/10/2023 0014   MCH 30.7 03/10/2023 0014   MCHC 34.1 03/10/2023 0014   RDW 12.0 03/10/2023 0014   LYMPHSABS 1.5 03/10/2023 0014   MONOABS 1.5 (H) 03/10/2023 0014   EOSABS 0.1 03/10/2023 0014   BASOSABS 0.1 03/10/2023 0014    CMP     Component Value Date/Time   NA 133 (L) 03/10/2023 0014   K 3.8 03/10/2023 0014   CL 98 03/10/2023 0014   CO2 24 03/10/2023 0014   GLUCOSE 95 03/10/2023 0014   BUN 19 03/10/2023 0014   CREATININE 1.37 (H) 03/10/2023 0014   CALCIUM 9.5 03/10/2023 0014   PROT 7.7 03/10/2023 0014   ALBUMIN 4.5 03/10/2023  0014   AST 51 (H) 03/10/2023 0014   ALT 56 (H) 03/10/2023 0014   ALKPHOS 69 03/10/2023 0014   BILITOT 1.0 03/10/2023 0014   GFRNONAA >60 03/10/2023 0014   GFRAA >60 01/08/2019 1323       Latest Ref Rng & Units 03/10/2023   12:14 AM 09/21/2021    8:24 AM 09/20/2021    1:29 AM  CBC EXTENDED  WBC 4.0 - 10.5 K/uL 14.8  10.2  10.6   RBC 4.22 - 5.81 MIL/uL 5.21  4.37  5.09   Hemoglobin 13.0 - 17.0 g/dL 16.1  09.6  04.5   HCT 39.0 - 52.0 % 46.9  39.9  45.1   Platelets 150 - 400 K/uL 233  236  284   NEUT# 1.7 - 7.7 K/uL 11.6   9.0   Lymph# 0.7 - 4.0 K/uL 1.5   1.2       ASSESSMENT AND PLAN:  38 year old male with known GERD with esophagitis and Schatzki ring, as well as gastric ulcers (H. Pylori neg) found on EGD in April 2024, presenting with recurrent dysphagia and occasional GERD breakthrough symptoms despite daily omeprazole .  Gastroesophageal reflux disease (GERD) Recurrent GERD symptoms, including acid taste in the mouth and regurgitation, particularly in the evening. Symptoms persist despite omeprazole . Hiatal hernia contributes to GERD by allowing reflux. Long-term acid-reducing medication likely needed. Dietary modifications discussed: avoid fatty, spicy foods, tomato-based sauces, coffee, peppermint, and chocolate. Advised to eat dinner 3-4 hours before bedtime and elevate the head of the bed. Surgery for hiatal hernia is elective unless symptoms are severe or medications are contraindicated. - Continue omeprazole ; consider increasing to twice daily if symptoms persist. - Consider adding Pepcid at night for frequent symptoms. - Implement dietary modifications. - Eat dinner 3-4 hours before bedtime. - Elevate the head of the bed.  Hiatal hernia Hiatal hernia contributes to GERD by allowing reflux. Surgical correction is elective unless symptoms are severe or medications are contraindicated. - Discuss surgical options if symptoms become severe or medications are  contraindicated.  Gastric ulcer Previous endoscopy revealed gastric ulcer. Repeat upper endoscopy needed to reassess healing. Ulcer not caused by H. pylori; likely related to NSAID use due to history of multiple surgeries and pain medication use. - Schedule repeat upper endoscopy to reassess ulcer healing. - Avoid NSAIDs to prevent further ulceration.  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 No current alcohol use, beneficial for overall health and GERD management. - Continue abstaining  from alcohol.      The details, risks (including bleeding, perforation, infection, missed lesions, medication reactions and possible hospitalization or surgery if complications occur), benefits, and alternatives to EGD with possible biopsy and possible dilation were discussed with the patient and he consents to proceed.    Pritika Alvarez E. Cherryl Corona, MD [facility]   No ref. provider found

## 2023-07-30 ENCOUNTER — Encounter: Payer: Self-pay | Admitting: Gastroenterology

## 2023-08-08 ENCOUNTER — Encounter: Admitting: Gastroenterology

## 2023-08-15 ENCOUNTER — Other Ambulatory Visit: Payer: Self-pay | Admitting: Adult Health

## 2023-09-06 ENCOUNTER — Other Ambulatory Visit: Payer: Self-pay

## 2023-09-06 ENCOUNTER — Ambulatory Visit
Admission: EM | Admit: 2023-09-06 | Discharge: 2023-09-06 | Disposition: A | Discharge status: Home / Self Care | Attending: Family Medicine | Admitting: Family Medicine

## 2023-09-06 ENCOUNTER — Emergency Department (HOSPITAL_COMMUNITY)
Admission: EM | Admit: 2023-09-06 | Discharge: 2023-09-06 | Discharge status: Against Medical Advice | Attending: Emergency Medicine | Admitting: Emergency Medicine

## 2023-09-06 DIAGNOSIS — K0889 Other specified disorders of teeth and supporting structures: Secondary | ICD-10-CM | POA: Insufficient documentation

## 2023-09-06 DIAGNOSIS — Z5321 Procedure and treatment not carried out due to patient leaving prior to being seen by health care provider: Secondary | ICD-10-CM | POA: Insufficient documentation

## 2023-09-06 DIAGNOSIS — K047 Periapical abscess without sinus: Secondary | ICD-10-CM

## 2023-09-06 MED ORDER — OXYCODONE-ACETAMINOPHEN 5-325 MG PO TABS
1.0000 | ORAL_TABLET | Freq: Once | ORAL | Status: AC
  Administered 2023-09-06: 1 via ORAL
  Filled 2023-09-06: qty 1

## 2023-09-06 MED ORDER — CELECOXIB 200 MG PO CAPS
200.0000 mg | ORAL_CAPSULE | Freq: Two times a day (BID) | ORAL | 0 refills | Status: DC
Start: 2023-09-06 — End: 2023-09-27

## 2023-09-06 MED ORDER — AMOXICILLIN-POT CLAVULANATE 875-125 MG PO TABS: 1.0000 | ORAL_TABLET | Freq: Two times a day (BID) | ORAL | 0 refills | Status: DC

## 2023-09-06 MED ORDER — HYDROCODONE-ACETAMINOPHEN 5-325 MG PO TABS: 1.0000 | ORAL_TABLET | Freq: Three times a day (TID) | ORAL | 0 refills | Status: DC | PRN

## 2023-09-06 NOTE — ED Triage Notes (Signed)
 Patient reports left upper molar pain onset this evening unrelieved by OTC pain medications.

## 2023-09-06 NOTE — ED Notes (Signed)
 Pt seen leaving out the waiting room.

## 2023-09-06 NOTE — ED Triage Notes (Signed)
 Pt reports dental pain since last night. States the Percocet given at the ED last night relief the [ain, but this morning the pain is back.

## 2023-09-06 NOTE — Discharge Instructions (Signed)
 Start Augmentin  to address your suspected dental infection. Follow up with your dental specialist asap. Please schedule celecoxib  twice daily with food for your severe pain.  If you still have pain despite taking celecoxib  regularly, this is breakthrough pain.  You can use hydrocodone , a narcotic pain medicine monitored by the DEA, once every 8 hours for this.  Once your pain is better controlled, switch back to just celecoxib .

## 2023-09-06 NOTE — ED Provider Notes (Signed)
 [facility]  Note:  This document was prepared using Conservation officer, historic buildings and may include unintentional dictation errors.  MRN: [id number] DOB: 03/16/1985  Subjective:   [place] Lund is a 38 y.o. male presenting for 1 day history of severe left lower dental pain.  Patient went to the emergency room last night, left without being seen.  Was provided with Percocet while there which helped relieve some of the pain but the medication is worn off and his pain is back.  He does have a dental specialist that he will try to follow-up with.  His primary goal is to get help with the pain.  No current facility-administered medications for this encounter.  Current Outpatient Medications:    albuterol  (VENTOLIN  HFA) 108 (90 Base) MCG/ACT inhaler, Inhale 1-2 puffs into the lungs., Disp: , Rfl:    anastrozole (ARIMIDEX) 1 MG tablet, Take 1 mg by mouth once a week. Tuesday, Disp: , Rfl:    buPROPion (WELLBUTRIN XL) 300 MG 24 hr tablet, Take 300 mg by mouth daily., Disp: , Rfl:    fluticasone  furoate-vilanterol (BREO ELLIPTA ) 200-25 MCG/ACT AEPB, INHALE 1 PUFF BY MOUTH DAILY, Disp: 60 each, Rfl: 0   omeprazole  (PRILOSEC) 40 MG capsule, TAKE 1 CAPSULE BY MOUTH 2 TIMES A DAY BEFORE A MEAL FOR 8 WEEKS, Disp: 180 capsule, Rfl: 0   No Known Allergies  Past Medical History:  Diagnosis Date   Asthma    GERD (gastroesophageal reflux disease)    TUMS as needed   Lateral meniscal tear 10/2015   right knee   Loose body of right knee 10/2015     Past Surgical History:  Procedure Laterality Date   BICEPS TENDON REPAIR     HERNIA REPAIR  2023   IRRIGATION AND DEBRIDEMENT ELBOW Right 09/19/2021   Procedure: IRRIGATION AND DEBRIDEMENT RIGHT ELBOW;  Surgeon: Sissy Cough, MD;  Location: [facility] OR;  Service: Orthopedics;  Laterality: Right;   KNEE ARTHROSCOPY W/ ACL RECONSTRUCTION Right 10/14/2009   KNEE ARTHROSCOPY W/ ACL RECONSTRUCTION Left    KNEE ARTHROSCOPY W/ ACL  RECONSTRUCTION Bilateral    2018   KNEE ARTHROSCOPY WITH LATERAL MENISECTOMY Right 11/02/2015   Procedure: Right KNEE ARTHROSCOPY WITH LATERAL MENISCECTOMY and removal of foreign body, microfracture, chondroplasty;  Surgeon: Lamar Millman, MD;  Location: [facility];  Service: Orthopedics;  Laterality: Right;  Right KNEE ARTHROSCOPY WITH LATERAL MENISCECTOMY and removal of foreign body, microfracture, chondroplasty   KNEE ARTHROSCOPY WITH MENISCAL REPAIR Right    LABRAL REPAIR      Family History  Problem Relation Age of Onset   Renal cancer Father    Liver disease Neg Hx    Colon cancer Neg Hx    Esophageal cancer Neg Hx     Social History   Tobacco Use   Smoking status: Former    Types: Cigarettes   Smokeless tobacco: Current    Types: Chew  Vaping Use   Vaping status: Some Days   Substances: Flavoring, Mixture of cannabinoids  Substance Use Topics   Alcohol use: Not Currently    Comment: occasionally   Drug use: No    ROS   Objective:   Vitals: BP 114/72 (BP Location: Left Arm)   Pulse 67   Temp 97.8 F (36.6 C) (Oral)   Resp 16   SpO2 94%   Physical Exam Constitutional:      General: He is not in acute distress.    Appearance: Normal appearance. He  is well-developed and normal weight. He is not ill-appearing, toxic-appearing or diaphoretic.  HENT:     Head: Normocephalic and atraumatic.     Right Ear: External ear normal.     Left Ear: External ear normal.     Nose: Nose normal.     Mouth/Throat:     Pharynx: Oropharynx is clear. No pharyngeal swelling, oropharyngeal exudate, posterior oropharyngeal erythema or uvula swelling.     Tonsils: No tonsillar exudate or tonsillar abscesses. 0 on the right. 0 on the left.   Eyes:     General: No scleral icterus.       Right eye: No discharge.        Left eye: No discharge.     Extraocular Movements: Extraocular movements intact.  Cardiovascular:     Rate and Rhythm: Normal rate.  Pulmonary:      Effort: Pulmonary effort is normal.  Musculoskeletal:     Cervical back: Normal range of motion.  Neurological:     Mental Status: He is alert and oriented to person, place, and time.  Psychiatric:        Mood and Affect: Mood normal.        Behavior: Behavior normal.        Thought Content: Thought content normal.        Judgment: Judgment normal.     Assessment and Plan :   I have reviewed the PDMP during this encounter.  1. Dental infection    Start Augmentin  for dental infection/abscess, use Celebrex  for pain and inflammation.  Patient requested narcotic pain medicine.  Recommended scheduling celecoxib , will provide a 3-day supply of hydrocodone .  Emphasized need for dental surgeon consult. Counseled patient on potential for adverse effects with medications prescribed/recommended today, strict ER and return-to-clinic precautions discussed, patient verbalized understanding.    [name], PA-C 09/06/23 1300

## 2023-09-18 ENCOUNTER — Other Ambulatory Visit: Payer: Self-pay | Admitting: Adult Health

## 2023-09-19 NOTE — Telephone Encounter (Signed)
 Courtesy refill patient needs appointment for further refills.

## 2023-09-27 ENCOUNTER — Encounter: Payer: Self-pay | Admitting: Gastroenterology

## 2023-09-27 ENCOUNTER — Ambulatory Visit: Admitting: Gastroenterology

## 2023-09-27 VITALS — BP 108/48 | HR 71 | Temp 98.1°F | Resp 22 | Ht 70.0 in | Wt 207.0 lb

## 2023-09-27 DIAGNOSIS — K2100 Gastro-esophageal reflux disease with esophagitis, without bleeding: Secondary | ICD-10-CM | POA: Diagnosis not present

## 2023-09-27 DIAGNOSIS — Q399 Congenital malformation of esophagus, unspecified: Secondary | ICD-10-CM

## 2023-09-27 DIAGNOSIS — K449 Diaphragmatic hernia without obstruction or gangrene: Secondary | ICD-10-CM

## 2023-09-27 DIAGNOSIS — Z8711 Personal history of peptic ulcer disease: Secondary | ICD-10-CM

## 2023-09-27 MED ORDER — SODIUM CHLORIDE 0.9 % IV SOLN: 500.0000 mL | INTRAVENOUS | Status: DC

## 2023-09-27 NOTE — Patient Instructions (Addendum)
 Continue present medications. Consider alternative PPI given persistent reflux esophagitis. Will discuss with patient.   Make sure to take Prilosec twice daily as prescribed- 1 tablet 30 minutes prior to breakfast and dinner.  Call office if you feel this isn't taking care of reflux                                                    YOU HAD AN ENDOSCOPIC PROCEDURE TODAY AT THE [facility]:   Refer to the procedure report that was given to you for any specific questions about what was found during the examination.  If the procedure report does not answer your questions, please call your gastroenterologist to clarify.  If you requested that your care partner not be given the details of your procedure findings, then the procedure report has been included in a sealed envelope for you to review at your convenience later.  YOU SHOULD EXPECT: Some feelings of bloating in the abdomen. Passage of more gas than usual.  Walking can help get rid of the air that was put into your GI tract during the procedure and reduce the bloating.  Please Note:  You might notice some irritation and congestion in your nose or some drainage.  This is from the oxygen used during your procedure.  There is no need for concern and it should clear up in a day or so.  SYMPTOMS TO REPORT IMMEDIATELY:  Following upper endoscopy (EGD)  Vomiting of blood or coffee ground material  New chest pain or pain under the shoulder blades  Painful or persistently difficult swallowing  New shortness of breath  Fever of 100F or higher  Black, tarry-looking stools  For urgent or emergent issues, a gastroenterologist can be reached at any hour by calling [phone]. Do not use MyChart messaging for urgent concerns.    DIET:  We do recommend a small meal at first, but then you may proceed to your regular diet.  Drink plenty of fluids but you should avoid alcoholic beverages for 24 hours.  ACTIVITY:  You should plan to take it  easy for the rest of today and you should NOT DRIVE or use heavy machinery until tomorrow (because of the sedation medicines used during the test).    FOLLOW UP: Our staff will call the number listed on your records the next business day following your procedure.  We will call around 7:15- 8:00 am to check on you and address any questions or concerns that you may have regarding the information given to you following your procedure. If we do not reach you, we will leave a message.       SIGNATURES/CONFIDENTIALITY: You and/or your care partner have signed paperwork which will be entered into your electronic medical record.  These signatures attest to the fact that that the information above on your After Visit Summary has been reviewed and is understood.  Full responsibility of the confidentiality of this discharge information lies with you and/or your care-partner.

## 2023-09-27 NOTE — Progress Notes (Signed)
 Asked pt about changing to Nexium from Prilosec- he states he doesn't want to change at this time- he hasn't been taking Prilosec as prescribed and then see how he is doing.  He says he will call back with update

## 2023-09-27 NOTE — Op Note (Signed)
 [facility] Patient Name: Joel Kelly Procedure Date: 09/27/2023 10:21 AM MRN: [id number] Endoscopist: Glendia E. Stacia , MD, [id number] Age: 38 Referring MD:  Date of Birth: 10-09-1985 Gender: Male Account #: [id number] Procedure:                Upper GI endoscopy Indications:              Follow-up of reflux esophagitis, Follow-up of                            gastric ulcer; he has frequent mild GERD symptoms                            currently on omeprazole  which he often takes twice                            daily Medicines:                Monitored Anesthesia Care Procedure:                Pre-Anesthesia Assessment:                           - Prior to the procedure, a History and Physical                            was performed, and patient medications and                            allergies were reviewed. The patient's tolerance of                            previous anesthesia was also reviewed. The risks                            and benefits of the procedure and the sedation                            options and risks were discussed with the patient.                            All questions were answered, and informed consent                            was obtained. Prior Anticoagulants: The patient has                            taken no anticoagulant or antiplatelet agents. ASA                            Grade Assessment: II - A patient with mild systemic                            disease. After reviewing the risks and benefits,  the patient was deemed in satisfactory condition to                            undergo the procedure.                           After obtaining informed consent, the endoscope was                            passed under direct vision. Throughout the                            procedure, the patient's blood pressure, pulse, and                            oxygen saturations were monitored continuously.  The                            Olympus Scope SN [id number] was introduced through the                            mouth, and advanced to the second part of duodenum.                            The upper GI endoscopy was accomplished without                            difficulty. The patient tolerated the procedure                            well. Scope In: Scope Out: Findings:                 Multiple areas of ectopic gastric mucosa were found                            in the upper third of the esophagus.                           LA Grade A (one or more mucosal breaks less than 5                            mm, not extending between tops of 2 mucosal folds)                            esophagitis was found in the lower third of the                            esophagus.                           The exam of the esophagus was otherwise normal.                           A 4  cm hiatal hernia was present.                           The entire examined stomach was normal.                           The examined duodenum was normal. Complications:            No immediate complications. Estimated Blood Loss:     Estimated blood loss: none. Impression:               - Ectopic gastric mucosa in the upper third of the                            esophagus.                           - LA Grade A reflux esophagitis. Improved from                            previous exam.                           - 4 cm hiatal hernia.                           - Normal stomach. The previously noted gastric                            ulcers have healed.                           - Normal examined duodenum.                           - No specimens collected. Recommendation:           - Patient has a contact number available for                            emergencies. The signs and symptoms of potential                            delayed complications were discussed with the                            patient. Return to  normal activities tomorrow.                            Written discharge instructions were provided to the                            patient.                           - Resume previous diet.                           -  Continue present medications. Consider                            alternative PPI given persistent reflux                            esophagitis. Will discuss with patient. Anice Wilshire E. Stacia, MD 09/27/2023 10:43:59 AM This report has been signed electronically.

## 2023-09-27 NOTE — Progress Notes (Signed)
 [facility] History and Physical   Primary Care Physician:  Patient, No Pcp Per   Reason for Procedure:   Dysphagia, follow up esophagitis/gastric ulcers  Plan:    EGD     HPI: Joel Kelly is a 38 y.o. male undergoing repeat EGD to assess healing of small gastric ulcers and reflux esophagitis seen on EGD in April 2024.  He continues to have mild GERD symptoms most days of the week despite BID omeprazole .  No recent problems with dysphagia.    Past Medical History:  Diagnosis Date   Asthma    GERD (gastroesophageal reflux disease)    TUMS as needed   Lateral meniscal tear 10/2015   right knee   Loose body of right knee 10/2015    Past Surgical History:  Procedure Laterality Date   BICEPS TENDON REPAIR     HERNIA REPAIR  2023   IRRIGATION AND DEBRIDEMENT ELBOW Right 09/19/2021   Procedure: IRRIGATION AND DEBRIDEMENT RIGHT ELBOW;  Surgeon: Sissy Cough, MD;  Location: [facility] OR;  Service: Orthopedics;  Laterality: Right;   KNEE ARTHROSCOPY W/ ACL RECONSTRUCTION Right 10/14/2009   KNEE ARTHROSCOPY W/ ACL RECONSTRUCTION Left    KNEE ARTHROSCOPY W/ ACL RECONSTRUCTION Bilateral    2018   KNEE ARTHROSCOPY WITH LATERAL MENISECTOMY Right 11/02/2015   Procedure: Right KNEE ARTHROSCOPY WITH LATERAL MENISCECTOMY and removal of foreign body, microfracture, chondroplasty;  Surgeon: Lamar Millman, MD;  Location: [facility];  Service: Orthopedics;  Laterality: Right;  Right KNEE ARTHROSCOPY WITH LATERAL MENISCECTOMY and removal of foreign body, microfracture, chondroplasty   KNEE ARTHROSCOPY WITH MENISCAL REPAIR Right    LABRAL REPAIR      Prior to Admission medications   Medication Sig Start Date End Date Taking? Authorizing Provider  anastrozole (ARIMIDEX) 1 MG tablet Take 1 mg by mouth once a week. Tuesday 08/22/21  Yes [provider]  BREO ELLIPTA  200-25 MCG/ACT AEPB INHALE 1 DOSE BY MOUTH DAILY 09/19/23  Yes Parrett, Tammy S, NP  buPROPion  (WELLBUTRIN XL) 300 MG 24 hr tablet Take 300 mg by mouth daily.   Yes [provider]  omeprazole  (PRILOSEC) 40 MG capsule TAKE 1 CAPSULE BY MOUTH 2 TIMES A DAY BEFORE A MEAL FOR 8 WEEKS 11/08/22  Yes Stacia Glendia BRAVO, MD  albuterol  (VENTOLIN  HFA) 108 (90 Base) MCG/ACT inhaler Inhale 1-2 puffs into the lungs. 10/10/20   [provider]    Current Outpatient Medications  Medication Sig Dispense Refill   anastrozole (ARIMIDEX) 1 MG tablet Take 1 mg by mouth once a week. Tuesday     BREO ELLIPTA  200-25 MCG/ACT AEPB INHALE 1 DOSE BY MOUTH DAILY 60 each 0   buPROPion (WELLBUTRIN XL) 300 MG 24 hr tablet Take 300 mg by mouth daily.     omeprazole  (PRILOSEC) 40 MG capsule TAKE 1 CAPSULE BY MOUTH 2 TIMES A DAY BEFORE A MEAL FOR 8 WEEKS 180 capsule 0   albuterol  (VENTOLIN  HFA) 108 (90 Base) MCG/ACT inhaler Inhale 1-2 puffs into the lungs.     Current Facility-Administered Medications  Medication Dose Route Frequency Provider Last Rate Last Admin   0.9 %  sodium chloride  infusion  500 mL Intravenous Continuous Stacia Glendia BRAVO, MD        Allergies as of 09/27/2023   (No Known Allergies)    Family History  Problem Relation Age of Onset   Renal cancer Father    Liver disease Neg Hx    Colon cancer Neg Hx  Esophageal cancer Neg Hx     Social History   Socioeconomic History   Marital status: Single    Spouse name: Not on file   Number of children: 2   Years of education: Not on file   Highest education level: Not on file  Occupational History   Occupation: Recurter  Tobacco Use   Smoking status: Former    Types: Cigarettes   Smokeless tobacco: Current    Types: Designer, multimedia Use   Vaping status: Some Days   Substances: Flavoring, Mixture of cannabinoids  Substance and Sexual Activity   Alcohol use: Not Currently    Comment: occasionally   Drug use: No   Sexual activity: Yes    Birth control/protection: None  Other Topics Concern   Not on file  Social  History Narrative   Not on file   Social Drivers of Health   Financial Resource Strain: Low Risk  (06/20/2022)   Received from [facility]   Overall Financial Resource Strain (CARDIA)    Difficulty of Paying Living Expenses: Not hard at all  Food Insecurity: No Food Insecurity (06/20/2022)   Received from [facility]   Hunger Vital Sign    Within the past 12 months, you worried that your food would run out before you got the money to buy more.: Never true    Within the past 12 months, the food you bought just didn't last and you didn't have money to get more.: Never true  Transportation Needs: No Transportation Needs (06/20/2022)   Received from [facility] - Transportation    Lack of Transportation (Medical): No    Lack of Transportation (Non-Medical): No  Physical Activity: Sufficiently Active (06/20/2022)   Received from [facility]   Exercise Vital Sign    On average, how many days per week do you engage in moderate to strenuous exercise (like a brisk walk)?: 5 days    On average, how many minutes do you engage in exercise at this level?: 60 min  Stress: Stress Concern Present (06/20/2022)   Received from [facility] - Occupational Stress Questionnaire    Feeling of Stress : To some extent  Social Connections: Somewhat Isolated (06/20/2022)   Received from [facility]   Social Network    How would you rate your social network (family, work, friends)?: Restricted participation with some degree of social isolation  Intimate Partner Violence: Not At Risk (06/20/2022)   Received from [facility]   HITS    Over the last 12 months how often did your partner physically hurt you?: Never    Over the last 12 months how often did your partner insult you or talk down to you?: Never    Over the last 12 months how often did your partner threaten you with physical harm?: Never    Over the last 12 months how often did your partner  scream or curse at you?: Never    Review of Systems:  All other review of systems negative except as mentioned in the HPI.  Physical Exam: Vital signs BP 131/82   Pulse 68   Temp 98.1 F (36.7 C)   Ht 5' 10 (1.778 m)   Wt 207 lb (93.9 kg)   SpO2 96%   BMI 29.70 kg/m   General:   Alert,  Well-developed, well-nourished, pleasant and cooperative in NAD Airway:  Mallampati 2 Lungs:  Clear throughout to auscultation.   Heart:  Regular rate and rhythm; no murmurs, clicks, rubs,  or gallops. Abdomen:  Soft, nontender and nondistended. Normal bowel sounds.   Neuro/Psych:  Normal mood and affect. A and O x 3   Conleigh Heinlein E. Stacia, MD [facility]

## 2023-09-30 ENCOUNTER — Telehealth: Payer: Self-pay | Admitting: Lactation Services

## 2023-09-30 NOTE — Telephone Encounter (Signed)
 No answer left voice mail

## 2023-10-16 ENCOUNTER — Other Ambulatory Visit: Payer: Self-pay | Admitting: Adult Health

## 2023-11-16 ENCOUNTER — Ambulatory Visit
Admission: EM | Admit: 2023-11-16 | Discharge: 2023-11-16 | Disposition: A | Discharge status: Home / Self Care | Attending: Family Medicine | Admitting: Family Medicine

## 2023-11-16 DIAGNOSIS — J4530 Mild persistent asthma, uncomplicated: Secondary | ICD-10-CM

## 2023-11-16 DIAGNOSIS — J309 Allergic rhinitis, unspecified: Secondary | ICD-10-CM

## 2023-11-16 DIAGNOSIS — J329 Chronic sinusitis, unspecified: Secondary | ICD-10-CM

## 2023-11-16 MED ORDER — PREDNISONE 20 MG PO TABS: ORAL_TABLET | ORAL | 0 refills | Status: AC

## 2023-11-16 MED ORDER — CEFDINIR 300 MG PO CAPS: 300.0000 mg | ORAL_CAPSULE | Freq: Two times a day (BID) | ORAL | 0 refills | Status: AC

## 2023-11-16 NOTE — ED Provider Notes (Signed)
 [facility]  Note:  This document was prepared using Conservation officer, historic buildings and may include unintentional dictation errors.  MRN: [id number] DOB: 09-20-1985  Subjective:   Joel Kelly is a 38 y.o. male presenting for 2 week history of persistent sinus congestion, sinus drainage, bilateral ear pain, malaise and fatigue. Has used multiple otc medications. No smoking. Has asthma, no wheezing, shob.   No current facility-administered medications for this encounter.  Current Outpatient Medications:    albuterol  (VENTOLIN  HFA) 108 (90 Base) MCG/ACT inhaler, Inhale 1-2 puffs into the lungs., Disp: , Rfl:    anastrozole (ARIMIDEX) 1 MG tablet, Take 1 mg by mouth once a week. Tuesday, Disp: , Rfl:    BREO ELLIPTA  200-25 MCG/ACT AEPB, INHALE 1 DOSE BY MOUTH DAILY, Disp: 60 each, Rfl: 0   buPROPion (WELLBUTRIN XL) 300 MG 24 hr tablet, Take 300 mg by mouth daily., Disp: , Rfl:    omeprazole  (PRILOSEC) 40 MG capsule, TAKE 1 CAPSULE BY MOUTH 2 TIMES A DAY BEFORE A MEAL FOR 8 WEEKS, Disp: 180 capsule, Rfl: 0   No Known Allergies  Past Medical History:  Diagnosis Date   Asthma    GERD (gastroesophageal reflux disease)    TUMS as needed   Lateral meniscal tear 10/2015   right knee   Loose body of right knee 10/2015     Past Surgical History:  Procedure Laterality Date   BICEPS TENDON REPAIR     HERNIA REPAIR  2023   IRRIGATION AND DEBRIDEMENT ELBOW Right 09/19/2021   Procedure: IRRIGATION AND DEBRIDEMENT RIGHT ELBOW;  Surgeon: Sissy Cough, MD;  Location: [facility] OR;  Service: Orthopedics;  Laterality: Right;   KNEE ARTHROSCOPY W/ ACL RECONSTRUCTION Right 10/14/2009   KNEE ARTHROSCOPY W/ ACL RECONSTRUCTION Left    KNEE ARTHROSCOPY W/ ACL RECONSTRUCTION Bilateral    2018   KNEE ARTHROSCOPY WITH LATERAL MENISECTOMY Right 11/02/2015   Procedure: Right KNEE ARTHROSCOPY WITH LATERAL MENISCECTOMY and removal of foreign body, microfracture, chondroplasty;   Surgeon: Lamar Millman, MD;  Location: [facility];  Service: Orthopedics;  Laterality: Right;  Right KNEE ARTHROSCOPY WITH LATERAL MENISCECTOMY and removal of foreign body, microfracture, chondroplasty   KNEE ARTHROSCOPY WITH MENISCAL REPAIR Right    LABRAL REPAIR      Family History  Problem Relation Age of Onset   Renal cancer Father    Liver disease Neg Hx    Colon cancer Neg Hx    Esophageal cancer Neg Hx     Social History   Tobacco Use   Smoking status: Former    Types: Cigarettes   Smokeless tobacco: Current    Types: Chew  Vaping Use   Vaping status: Some Days   Substances: Flavoring, Mixture of cannabinoids  Substance Use Topics   Alcohol use: Not Currently    Comment: occasionally   Drug use: No    ROS   Objective:   Vitals: BP 115/81 (BP Location: Right Arm)   Pulse 76   Temp 98.6 F (37 C) (Temporal)   Resp 16   SpO2 96%   Physical Exam Constitutional:      General: He is not in acute distress.    Appearance: Normal appearance. He is normal weight. He is not ill-appearing, toxic-appearing or diaphoretic.  HENT:     Head: Normocephalic and atraumatic.     Right Ear: Ear canal and external ear normal. No drainage, swelling or tenderness. A middle ear effusion is present. There is no  impacted cerumen. Tympanic membrane is not erythematous or bulging.     Left Ear: Ear canal and external ear normal. No drainage, swelling or tenderness. A middle ear effusion is present. There is no impacted cerumen. Tympanic membrane is not erythematous or bulging.     Nose: Congestion present. No rhinorrhea.     Mouth/Throat:     Mouth: Mucous membranes are moist.     Pharynx: No oropharyngeal exudate or posterior oropharyngeal erythema.  Eyes:     General: No scleral icterus.       Right eye: No discharge.        Left eye: No discharge.     Extraocular Movements: Extraocular movements intact.     Conjunctiva/sclera: Conjunctivae normal.   Cardiovascular:     Rate and Rhythm: Normal rate.  Pulmonary:     Effort: Pulmonary effort is normal.  Musculoskeletal:     Cervical back: Normal range of motion and neck supple. No rigidity. No muscular tenderness.  Neurological:     General: No focal deficit present.     Mental Status: He is alert and oriented to person, place, and time.  Psychiatric:        Mood and Affect: Mood normal.        Behavior: Behavior normal.     Assessment and Plan :   PDMP not reviewed this encounter.  1. Recurrent sinusitis   2. Allergic rhinitis, unspecified seasonality, unspecified trigger   3. Mild persistent asthma, uncomplicated    Acute on chronic recurrent sinusitis.  Recommend cefdinir .  Also advised prednisone  for underlying allergic rhinitis and asthma.  Counseled patient on potential for adverse effects with medications prescribed/recommended today, ER and return-to-clinic precautions discussed, patient verbalized understanding.    Joel Savannah, PA-C 11/16/23 1210

## 2023-11-16 NOTE — ED Triage Notes (Signed)
 Pt states congestion,runny and stuffy nose pressure in his face,ear pain, and fatigue for the past week. States he has  been taking OTC cold and flu medicine at home.

## 2024-03-25 ENCOUNTER — Other Ambulatory Visit: Payer: Self-pay | Admitting: Adult Health

## 2024-03-31 NOTE — Telephone Encounter (Signed)
 Received faxed refill request from [facility] for Breo inhaler. Refill denied as pt has not been seen since 05/2022
